# Patient Record
Sex: Female | Born: 1975 | Race: Black or African American | Hispanic: No | Marital: Single | Smoking: Never smoker
Health system: Southern US, Community
[De-identification: ages and names within clinical notes are randomized; demographics above are authoritative.]

## PROBLEM LIST (undated history)

## (undated) DIAGNOSIS — R011 Cardiac murmur, unspecified: Secondary | ICD-10-CM

## (undated) DIAGNOSIS — R51 Headache: Secondary | ICD-10-CM

## (undated) DIAGNOSIS — R3589 Other polyuria: Secondary | ICD-10-CM

## (undated) DIAGNOSIS — B001 Herpesviral vesicular dermatitis: Secondary | ICD-10-CM

## (undated) DIAGNOSIS — E785 Hyperlipidemia, unspecified: Secondary | ICD-10-CM

## (undated) DIAGNOSIS — G4730 Sleep apnea, unspecified: Secondary | ICD-10-CM

## (undated) DIAGNOSIS — Z9289 Personal history of other medical treatment: Secondary | ICD-10-CM

## (undated) DIAGNOSIS — D219 Benign neoplasm of connective and other soft tissue, unspecified: Secondary | ICD-10-CM

## (undated) DIAGNOSIS — I774 Celiac artery compression syndrome: Secondary | ICD-10-CM

## (undated) DIAGNOSIS — D649 Anemia, unspecified: Secondary | ICD-10-CM

## (undated) DIAGNOSIS — R519 Headache, unspecified: Secondary | ICD-10-CM

## (undated) HISTORY — PX: WISDOM TOOTH EXTRACTION: SHX21

## (undated) HISTORY — DX: Sleep apnea, unspecified: G47.30

## (undated) HISTORY — DX: Other polyuria: R35.89

## (undated) HISTORY — DX: Herpesviral vesicular dermatitis: B00.1

## (undated) HISTORY — PX: BREAST SURGERY: SHX581

## (undated) HISTORY — DX: Hyperlipidemia, unspecified: E78.5

## (undated) HISTORY — PX: CHOLECYSTECTOMY: SHX55

---

## 2000-05-23 ENCOUNTER — Encounter: Payer: Self-pay | Admitting: Obstetrics & Gynecology

## 2000-05-23 ENCOUNTER — Ambulatory Visit (HOSPITAL_COMMUNITY): Admission: RE | Admit: 2000-05-23 | Discharge: 2000-05-23 | Payer: Self-pay | Admitting: Obstetrics & Gynecology

## 2000-07-21 ENCOUNTER — Ambulatory Visit (HOSPITAL_COMMUNITY): Admission: RE | Admit: 2000-07-21 | Discharge: 2000-07-21 | Payer: Self-pay | Admitting: Obstetrics and Gynecology

## 2000-09-29 ENCOUNTER — Inpatient Hospital Stay (HOSPITAL_COMMUNITY): Admission: AD | Admit: 2000-09-29 | Discharge: 2000-10-01 | Payer: Self-pay | Admitting: Obstetrics & Gynecology

## 2003-06-28 ENCOUNTER — Other Ambulatory Visit: Admission: RE | Admit: 2003-06-28 | Discharge: 2003-06-28 | Payer: Self-pay | Admitting: Obstetrics & Gynecology

## 2003-11-06 ENCOUNTER — Ambulatory Visit (HOSPITAL_COMMUNITY): Admission: RE | Admit: 2003-11-06 | Discharge: 2003-11-06 | Payer: Self-pay | Admitting: Obstetrics & Gynecology

## 2003-11-06 ENCOUNTER — Other Ambulatory Visit: Admission: RE | Admit: 2003-11-06 | Discharge: 2003-11-06 | Payer: Self-pay | Admitting: Obstetrics and Gynecology

## 2004-01-20 ENCOUNTER — Inpatient Hospital Stay (HOSPITAL_COMMUNITY): Admission: AD | Admit: 2004-01-20 | Discharge: 2004-01-23 | Payer: Self-pay | Admitting: *Deleted

## 2004-02-17 ENCOUNTER — Other Ambulatory Visit: Admission: RE | Admit: 2004-02-17 | Discharge: 2004-02-17 | Payer: Self-pay | Admitting: Obstetrics and Gynecology

## 2016-02-04 ENCOUNTER — Encounter: Payer: Self-pay | Admitting: Gastroenterology

## 2016-02-04 DIAGNOSIS — K2950 Unspecified chronic gastritis without bleeding: Secondary | ICD-10-CM | POA: Diagnosis not present

## 2016-02-04 DIAGNOSIS — K317 Polyp of stomach and duodenum: Secondary | ICD-10-CM | POA: Diagnosis not present

## 2016-02-04 DIAGNOSIS — D131 Benign neoplasm of stomach: Secondary | ICD-10-CM | POA: Diagnosis not present

## 2016-02-04 DIAGNOSIS — D509 Iron deficiency anemia, unspecified: Secondary | ICD-10-CM | POA: Diagnosis not present

## 2016-02-04 DIAGNOSIS — K219 Gastro-esophageal reflux disease without esophagitis: Secondary | ICD-10-CM | POA: Diagnosis not present

## 2016-02-04 DIAGNOSIS — Z9049 Acquired absence of other specified parts of digestive tract: Secondary | ICD-10-CM | POA: Diagnosis not present

## 2016-02-04 DIAGNOSIS — F419 Anxiety disorder, unspecified: Secondary | ICD-10-CM | POA: Diagnosis not present

## 2016-02-04 DIAGNOSIS — K580 Irritable bowel syndrome with diarrhea: Secondary | ICD-10-CM | POA: Diagnosis not present

## 2016-02-04 DIAGNOSIS — R1310 Dysphagia, unspecified: Secondary | ICD-10-CM | POA: Diagnosis not present

## 2016-02-04 HISTORY — PX: ESOPHAGOGASTRODUODENOSCOPY: SHX1529

## 2016-02-06 DIAGNOSIS — Z124 Encounter for screening for malignant neoplasm of cervix: Secondary | ICD-10-CM | POA: Diagnosis not present

## 2016-02-06 DIAGNOSIS — N760 Acute vaginitis: Secondary | ICD-10-CM | POA: Diagnosis not present

## 2016-02-06 DIAGNOSIS — Z01419 Encounter for gynecological examination (general) (routine) without abnormal findings: Secondary | ICD-10-CM | POA: Diagnosis not present

## 2016-02-06 DIAGNOSIS — Z6829 Body mass index (BMI) 29.0-29.9, adult: Secondary | ICD-10-CM | POA: Diagnosis not present

## 2016-03-08 DIAGNOSIS — R197 Diarrhea, unspecified: Secondary | ICD-10-CM | POA: Diagnosis not present

## 2016-03-08 DIAGNOSIS — K2900 Acute gastritis without bleeding: Secondary | ICD-10-CM | POA: Diagnosis not present

## 2016-03-08 DIAGNOSIS — K580 Irritable bowel syndrome with diarrhea: Secondary | ICD-10-CM | POA: Diagnosis not present

## 2016-03-08 DIAGNOSIS — F419 Anxiety disorder, unspecified: Secondary | ICD-10-CM | POA: Diagnosis not present

## 2016-03-08 DIAGNOSIS — Z9049 Acquired absence of other specified parts of digestive tract: Secondary | ICD-10-CM | POA: Diagnosis not present

## 2016-03-08 DIAGNOSIS — Z79899 Other long term (current) drug therapy: Secondary | ICD-10-CM | POA: Diagnosis not present

## 2016-03-08 DIAGNOSIS — K648 Other hemorrhoids: Secondary | ICD-10-CM | POA: Diagnosis not present

## 2016-03-08 DIAGNOSIS — D131 Benign neoplasm of stomach: Secondary | ICD-10-CM | POA: Diagnosis not present

## 2016-03-08 DIAGNOSIS — D509 Iron deficiency anemia, unspecified: Secondary | ICD-10-CM | POA: Diagnosis not present

## 2016-03-08 DIAGNOSIS — K6389 Other specified diseases of intestine: Secondary | ICD-10-CM | POA: Diagnosis not present

## 2016-03-08 HISTORY — PX: COLONOSCOPY: SHX174

## 2016-05-13 DIAGNOSIS — N920 Excessive and frequent menstruation with regular cycle: Secondary | ICD-10-CM | POA: Diagnosis not present

## 2016-05-21 DIAGNOSIS — L509 Urticaria, unspecified: Secondary | ICD-10-CM | POA: Diagnosis not present

## 2016-08-02 DIAGNOSIS — N926 Irregular menstruation, unspecified: Secondary | ICD-10-CM | POA: Diagnosis not present

## 2016-08-02 DIAGNOSIS — Z113 Encounter for screening for infections with a predominantly sexual mode of transmission: Secondary | ICD-10-CM | POA: Diagnosis not present

## 2016-08-02 DIAGNOSIS — Z6829 Body mass index (BMI) 29.0-29.9, adult: Secondary | ICD-10-CM | POA: Diagnosis not present

## 2016-09-09 DIAGNOSIS — Z6829 Body mass index (BMI) 29.0-29.9, adult: Secondary | ICD-10-CM | POA: Diagnosis not present

## 2016-09-09 DIAGNOSIS — D259 Leiomyoma of uterus, unspecified: Secondary | ICD-10-CM | POA: Diagnosis not present

## 2016-09-09 DIAGNOSIS — N921 Excessive and frequent menstruation with irregular cycle: Secondary | ICD-10-CM | POA: Diagnosis not present

## 2016-09-14 DIAGNOSIS — R1031 Right lower quadrant pain: Secondary | ICD-10-CM | POA: Diagnosis not present

## 2016-09-14 DIAGNOSIS — I774 Celiac artery compression syndrome: Secondary | ICD-10-CM | POA: Diagnosis not present

## 2016-09-14 DIAGNOSIS — R079 Chest pain, unspecified: Secondary | ICD-10-CM | POA: Diagnosis not present

## 2016-09-14 DIAGNOSIS — R102 Pelvic and perineal pain: Secondary | ICD-10-CM | POA: Diagnosis not present

## 2016-09-14 DIAGNOSIS — R1032 Left lower quadrant pain: Secondary | ICD-10-CM | POA: Diagnosis not present

## 2016-09-16 ENCOUNTER — Observation Stay (HOSPITAL_COMMUNITY)
Admission: AD | Admit: 2016-09-16 | Discharge: 2016-09-17 | Disposition: A | Discharge status: Home / Self Care | Payer: BLUE CROSS/BLUE SHIELD | Source: Ambulatory Visit | Attending: Obstetrics | Admitting: Obstetrics

## 2016-09-16 ENCOUNTER — Encounter (HOSPITAL_COMMUNITY): Payer: Self-pay

## 2016-09-16 DIAGNOSIS — D62 Acute posthemorrhagic anemia: Principal | ICD-10-CM | POA: Insufficient documentation

## 2016-09-16 DIAGNOSIS — N939 Abnormal uterine and vaginal bleeding, unspecified: Secondary | ICD-10-CM | POA: Diagnosis present

## 2016-09-16 DIAGNOSIS — D500 Iron deficiency anemia secondary to blood loss (chronic): Secondary | ICD-10-CM

## 2016-09-16 DIAGNOSIS — N920 Excessive and frequent menstruation with regular cycle: Secondary | ICD-10-CM | POA: Diagnosis not present

## 2016-09-16 HISTORY — DX: Benign neoplasm of connective and other soft tissue, unspecified: D21.9

## 2016-09-16 HISTORY — DX: Celiac artery compression syndrome: I77.4

## 2016-09-16 LAB — CBC
HCT: 25 % — ABNORMAL LOW (ref 36.0–46.0)
Hemoglobin: 8.2 g/dL — ABNORMAL LOW (ref 12.0–15.0)
MCH: 27.2 pg (ref 26.0–34.0)
MCHC: 32.8 g/dL (ref 30.0–36.0)
MCV: 82.8 fL (ref 78.0–100.0)
Platelets: 258 10*3/uL (ref 150–400)
RBC: 3.02 MIL/uL — ABNORMAL LOW (ref 3.87–5.11)
RDW: 14.5 % (ref 11.5–15.5)
WBC: 9.5 10*3/uL (ref 4.0–10.5)

## 2016-09-16 LAB — URINALYSIS, ROUTINE W REFLEX MICROSCOPIC
Bilirubin Urine: NEGATIVE
Glucose, UA: NEGATIVE mg/dL
Ketones, ur: NEGATIVE mg/dL
Nitrite: NEGATIVE
Protein, ur: 100 mg/dL — AB
Specific Gravity, Urine: 1.01 (ref 1.005–1.030)
pH: 7 (ref 5.0–8.0)

## 2016-09-16 LAB — URINE MICROSCOPIC-ADD ON: Bacteria, UA: NONE SEEN

## 2016-09-16 LAB — PREPARE RBC (CROSSMATCH)

## 2016-09-16 LAB — ABO/RH: ABO/RH(D): O NEG

## 2016-09-16 MED ORDER — ESTROGENS CONJUGATED 25 MG IJ SOLR
25.0000 mg | Freq: Once | INTRAMUSCULAR | Status: DC
  Administered 2016-09-16: 25 mg via INTRAVENOUS
  Filled 2016-09-16: qty 25

## 2016-09-16 MED ORDER — FAMOTIDINE 20 MG PO TABS
20.0000 mg | ORAL_TABLET | Freq: Two times a day (BID) | ORAL | Status: DC
  Administered 2016-09-16: 20 mg via ORAL
  Filled 2016-09-16 (×2): qty 1

## 2016-09-16 MED ORDER — LACTATED RINGERS IV BOLUS (SEPSIS)
1000.0000 mL | Freq: Once | INTRAVENOUS | Status: AC
  Administered 2016-09-16: 1000 mL via INTRAVENOUS

## 2016-09-16 MED ORDER — SODIUM CHLORIDE 0.9 % IV SOLN
Freq: Once | INTRAVENOUS | Status: AC
  Administered 2016-09-16: 19:00:00 via INTRAVENOUS

## 2016-09-16 MED ORDER — ESTROGENS CONJUGATED 25 MG IJ SOLR
25.0000 mg | Freq: Four times a day (QID) | INTRAMUSCULAR | Status: DC | PRN
  Filled 2016-09-16: qty 25

## 2016-09-16 MED ORDER — IBUPROFEN 800 MG PO TABS
800.0000 mg | ORAL_TABLET | Freq: Three times a day (TID) | ORAL | Status: DC | PRN
  Administered 2016-09-16 – 2016-09-17 (×2): 800 mg via ORAL
  Filled 2016-09-16 (×2): qty 1

## 2016-09-16 MED ORDER — DIPHENHYDRAMINE HCL 25 MG PO CAPS
25.0000 mg | ORAL_CAPSULE | Freq: Once | ORAL | Status: AC
  Administered 2016-09-16: 25 mg via ORAL
  Filled 2016-09-16: qty 1

## 2016-09-16 MED ORDER — ACETAMINOPHEN 325 MG PO TABS
650.0000 mg | ORAL_TABLET | Freq: Once | ORAL | Status: AC
  Administered 2016-09-16: 650 mg via ORAL
  Filled 2016-09-16: qty 2

## 2016-09-16 NOTE — H&P (Signed)
40 y.o. [id number] presented to the office today with a five day history of acute heavy menstrual bleeding for which she was symptomatic. Has a history of heavy menstrual bleeding with typical menses lasting 5 days, managed w OCPs.  Over last 2 months, bleeding has been heavier and longer.  She had an [place] in the office on 11/10 that showed a 5 cm intramural fibroid.  Plan was for definitive therapy w TVH.   Hemoglobin at that time was 11.0  Menses started on 11/12.  Since that time, she has been changing a pad or pad + tampon q1-2 hours.  Significant clots and flooding.  An OCP taper was started on 11/13 with no improvement.  She had associated chest pain and abdominal pain on 09/14/16.  Was seen at [facility] ED. CT was negative for pulmonary embolism, aortic dissection.  Known fibroid uterus was seen.    She presented to the office today with continued heavy bleeding, passing fist sized clots and changing pad hourly.   Endorses feeling dizzy/lightheaded, seems to be worsening.  Feels like she may black out.  C/o rapid heart rate.  Hgb 11 on 11/10, 9.9 on 11/14, 8.2 today.  Had + orthostatics in MAU w pulse increase from 115 to 144 from lying to standing and a drop in diastolic BP of 0000000 points.    PMHx: Iron deficiency anemia on PO iron PSHx: cholecystectomy, c/s x 1 OB: G2P2, SVD x 1 Social: non-smoker  VS: T 99.2 P 115 BP 126/79 General: pale, tired Cardiac: tachycardic, regular rhythm Lungs: clear to auscultation Pelvic: Moderate clot cleared from vagina, active bleeding.  Cervix is dilated to 1 cm.  Uterus anteverted, mobile, 8-10 wk size, nontender.  Adnexa wnl, non-tender.    A&P:  40 yo g2p2 with acute menorrhagia to symptomatic anemia Failed outpatient management w ocps tapers Admit for IV estrogen for bleeding control q6h prn x 24h.  Pad counts Symptomatic ABLA--will transfuse 1 unit prbcs now.  Repeat orthostatic VS and 4h post transfusion cbc UPT neg in office on 11/10.  Was not  repeated in MAU--will confirm neg again today.          Kenneth

## 2016-09-16 NOTE — MAU Provider Note (Signed)
History     CSN: [id number]  Arrival date and time: 09/16/16 1248   First Provider Initiated Contact with Patient 09/16/16 1336      Chief Complaint  Patient presents with  . Vaginal Bleeding   Destiny Butler is a 40 y.o. [id number] female who presents from office for vaginal bleeding. Patient is being followed by Destiny Butler for uterine fibroids and AUB. Seen in office today. Patient reports passing multiple tennis ball sized clots. Complains of abdominal pressure and low back pain. Rates back pain 5/10. Has not treated. Changing position improves symptoms. States she feels weak and just "doesn't feel like myself".       Past Medical History:  Diagnosis Date  . Celiac artery compression syndrome (HCC)    pt told she needs to have surgery to fix   . Fibroid     Past Surgical History:  Procedure Laterality Date  . CESAREAN SECTION    . CHOLECYSTECTOMY    . WISDOM TOOTH EXTRACTION      Family History  Problem Relation Age of Onset  . Diabetes Mother   . Hypertension Mother   . Hypertension Maternal Grandmother     Social History  Substance Use Topics  . Smoking status: Never Smoker  . Smokeless tobacco: Never Used  . Alcohol use No    Allergies: No Known Allergies  Prescriptions Prior to Admission  Medication Sig Dispense Refill Last Dose  . ferrous sulfate 325 (65 FE) MG tablet Take 325 mg by mouth 3 (three) times daily with meals.   09/16/2016 at Unknown time  . MONO-LINYAH 0.25-35 MG-MCG tablet Take 3 tablets by mouth daily.  0 09/15/2016 at Unknown time    Review of Systems  Constitutional: Negative for chills and fever.  Cardiovascular: Negative for palpitations.  Gastrointestinal: Negative.   Genitourinary:       + vaginal bleeding  Musculoskeletal: Positive for back pain.  Neurological: Positive for weakness. Negative for dizziness, loss of consciousness and headaches.   Physical Exam   Blood pressure 126/79, pulse 115, temperature 99.2 F (37.3  C), temperature source Oral, resp. rate 18, height 5\' 3"  (1.6 m), weight 167 lb (75.8 kg), last menstrual period 09/12/2016, SpO2 100 %.  Orthostatic VS for the past 24 hrs:  BP- Lying Pulse- Lying BP- Sitting Pulse- Sitting BP- Standing at 0 minutes Pulse- Standing at 0 minutes  09/16/16 1327 126/79 115 129/74 123 120/73 141       Physical Exam  Nursing note and vitals reviewed. Constitutional: She is oriented to person, place, and time. She appears well-developed and well-nourished. No distress.  HENT:  Head: Normocephalic and atraumatic.  Eyes: Conjunctivae are normal. Right eye exhibits no discharge. Left eye exhibits no discharge. No scleral icterus.  Neck: Normal range of motion.  Cardiovascular: Regular rhythm and normal heart sounds.  Tachycardia present.   No murmur heard. Respiratory: Effort normal and breath sounds normal. No respiratory distress. She has no wheezes.  Neurological: She is alert and oriented to person, place, and time.  Skin: Skin is warm and dry. She is not diaphoretic.  Psychiatric: She has a normal mood and affect. Her behavior is normal. Judgment and thought content normal.    MAU Course  Procedures Results for orders placed or performed during the hospital encounter of 09/16/16 (from the past 24 hour(s))  CBC     Status: Abnormal   Collection Time: 09/16/16 12:59 PM  Result Value Ref Range   WBC 9.5 4.0 -  10.5 K/uL   RBC 3.02 (L) 3.87 - 5.11 MIL/uL   Hemoglobin 8.2 (L) 12.0 - 15.0 g/dL   HCT 25.0 (L) 36.0 - 46.0 %   MCV 82.8 78.0 - 100.0 fL   MCH 27.2 26.0 - 34.0 pg   MCHC 32.8 30.0 - 36.0 g/dL   RDW 14.5 11.5 - 15.5 %   Platelets 258 150 - 400 K/uL  Urinalysis, Routine w reflex microscopic (not at [facility])     Status: Abnormal   Collection Time: 09/16/16  1:04 PM  Result Value Ref Range   Color, Urine RED (A) YELLOW   APPearance CLOUDY (A) CLEAR   Specific Gravity, Urine 1.010 1.005 - 1.030   pH 7.0 5.0 - 8.0   Glucose, UA NEGATIVE  NEGATIVE mg/dL   Hgb urine dipstick LARGE (A) NEGATIVE   Bilirubin Urine NEGATIVE NEGATIVE   Ketones, ur NEGATIVE NEGATIVE mg/dL   Protein, ur 100 (A) NEGATIVE mg/dL   Nitrite NEGATIVE NEGATIVE   Leukocytes, UA TRACE (A) NEGATIVE  Urine microscopic-add on     Status: Abnormal   Collection Time: 09/16/16  1:04 PM  Result Value Ref Range   Squamous Epithelial / LPF 0-5 (A) NONE SEEN   WBC, UA 0-5 0 - 5 WBC/hpf   RBC / HPF TOO NUMEROUS TO COUNT 0 - 5 RBC/hpf   Bacteria, UA NONE SEEN NONE SEEN  Type and screen [place]     Status: None (Preliminary result)   Collection Time: 09/16/16  1:55 PM  Result Value Ref Range   ABO/RH(D) O NEG    Antibody Screen PENDING    Sample Expiration 09/19/2016     MDM CBC, orthostatic VS, & IV fluids per Destiny Butler Hemoglobin 8.2 -- was 9.9 on 11/14 & 11+ last week  Pt tachycardia while standing during orthostatics Destiny Butler notified of VS & hgb. IV being started. Will consent for blood. Destiny Butler working on admission orders   Assessment and Plan  A:  1. Abnormal uterine bleeding (AUB)   2. Anemia due to chronic blood loss    P: Admit to Women's unit for blood transfusion & observation  [name] 09/16/2016, 2:32 PM

## 2016-09-16 NOTE — MAU Note (Signed)
Pt sent over from MD office due to vaginal bleeding. Pt states she has a fibroid that takes up most of her uterus. Pt states she has been having progressively worse bleeding with her periods over the last year. Pt states the last two months she has been having tennis ball size clots. Pt states she been having progressively worsening pain with her periods.

## 2016-09-17 ENCOUNTER — Telehealth (HOSPITAL_COMMUNITY): Payer: Self-pay

## 2016-09-17 DIAGNOSIS — D649 Anemia, unspecified: Secondary | ICD-10-CM | POA: Diagnosis not present

## 2016-09-17 DIAGNOSIS — N939 Abnormal uterine and vaginal bleeding, unspecified: Secondary | ICD-10-CM | POA: Diagnosis not present

## 2016-09-17 LAB — TYPE AND SCREEN
ABO/RH(D): O NEG
Antibody Screen: NEGATIVE
Unit division: 0

## 2016-09-17 LAB — CBC
HCT: 21.7 % — ABNORMAL LOW (ref 36.0–46.0)
Hemoglobin: 7.4 g/dL — ABNORMAL LOW (ref 12.0–15.0)
MCH: 28.4 pg (ref 26.0–34.0)
MCHC: 34.1 g/dL (ref 30.0–36.0)
MCV: 83.1 fL (ref 78.0–100.0)
Platelets: 193 10*3/uL (ref 150–400)
RBC: 2.61 MIL/uL — ABNORMAL LOW (ref 3.87–5.11)
RDW: 14.3 % (ref 11.5–15.5)
WBC: 8.5 10*3/uL (ref 4.0–10.5)

## 2016-09-17 LAB — PREGNANCY, URINE: Preg Test, Ur: NEGATIVE

## 2016-09-17 MED ORDER — MEDROXYPROGESTERONE ACETATE 10 MG PO TABS: 10.0000 mg | ORAL_TABLET | Freq: Three times a day (TID) | ORAL | 1 refills | Status: DC

## 2016-09-17 MED ORDER — MEDROXYPROGESTERONE ACETATE 10 MG PO TABS
10.0000 mg | ORAL_TABLET | Freq: Three times a day (TID) | ORAL | Status: DC
  Administered 2016-09-17: 10 mg via ORAL
  Filled 2016-09-17: qty 1

## 2016-09-17 MED ORDER — FERROUS SULFATE 325 (65 FE) MG PO TABS
325.0000 mg | ORAL_TABLET | Freq: Three times a day (TID) | ORAL | Status: DC
  Administered 2016-09-17: 325 mg via ORAL
  Filled 2016-09-17: qty 1

## 2016-09-17 NOTE — Discharge Instructions (Signed)
Please take your iron tablets 3 times per day.   Constipation is a common side effect of iron--you can add one dose of miralax daily as needed for constipation.   Please start a provera taper:  1 tablet of provera 3 times per day x 3 days, then 2 times per day x 3 days, then 1 tablet daily until surgery.     If you experience a recurrence of heavy vaginal bleeding (soaking through > 1 pad per hour for multiple hours ina row), please call the office.    Keep your follow up appointment with Dr. Carlis Abbott on 09/30/16

## 2016-09-17 NOTE — Discharge Summary (Signed)
40 yo g2p2 admitted with symptomatic heavy menstrual bleeding and acute blood loss anemia. She was admitted for transfusion and IV estrogen.  HMB decreased significantly with 1 dose of IV premarin.  She was transitioned to a PO provera taper.   Her symptoms of dizziness, sob, palpitations resolved with transfusion of 1 U PRBCs.  Her tachycardia resolved.  As she is symptomatically improved, she was discharged home with PO iron and PO provera as above.  F/u w Dr. Carlis Abbott scheduled on 11/30

## 2016-09-17 NOTE — Progress Notes (Signed)
Pt seen and examined.  Received 1 dose of IV premarin overnight and 1 unit of PRBCs.  Tachycardia improved.  She denies further palpitations.  Reports bleeding has decreased significantly and has not passed any additional clots in the last few hours.  Pelvic/abdominal pain has improved--zantac and motrin worked well overnight.  Feels a bit better, but states that she is "still very tired and lethargic"  BP 112/69 (BP Location: Left Arm)   Pulse 90   Temp 98.6 F (37 C) (Oral)   Resp 16   Ht 5\' 3"  (1.6 m)   Wt 75.8 kg (167 lb)   LMP 09/12/2016 (Exact Date)   SpO2 100%   BMI 29.58 kg/m   NAD, pale Abd: soft, non-tender, non-distended Pelvic: pad in place for 2+ hours--light blood, no clots  Lab Results  Component Value Date   WBC 8.5 09/17/2016   HGB 7.4 (L) 09/17/2016   HCT 21.7 (L) 09/17/2016   MCV 83.1 09/17/2016   PLT 193 09/17/2016    A&P: g2p2 with ABLA secondary to HMB Bleeding has improved significantly--d/c IV estrogen and transition to PO provera ABLA--vital signs improved.  Hgb decreased post-transfusion to 7.4--I suspect that the initial hgb of 8.2 was artificially high due to dehydration. Will not transfuse second unit based on hgb alone.  Symptoms somewhat improved, VS improved.  Will have pt ambulate this AM.  If continued dizziness / SOB w ambulation, will transfuse additional unit.  If she does well w ambulation, will d/c to home PO iron TID

## 2016-09-17 NOTE — Progress Notes (Signed)
Discharge teaching complete. Pt understood all information and did not have any questions. Pt pushed via wheelchair and discharged home to family. 

## 2016-09-17 NOTE — Progress Notes (Signed)
Pt ambulated with nursing assistance this AM.  Prior to ambulation, peripad was <75% full after 4 hours improvement in bleeding. Few clots, quarter size or less w voiding.    With ambulation, pt reported fatigue, but denies dizziness, palpitations or shortness of breath.    Vital signs following ambulation: BP 129/65 (BP Location: Right Arm)   Pulse (!) 103   Temp 98.4 F (36.9 C) (Oral)   Resp 16   Ht 5\' 3"  (1.6 m)   Wt 75.8 kg (167 lb)   LMP 09/12/2016 (Exact Date)   SpO2 100%   BMI 29.58 kg/m   A&P: Doing well s/p 1 U PRBCs and 1 dose of IV premarin Will d/c home w PO provera and iron F/u scheduled.

## 2016-09-29 ENCOUNTER — Encounter: Payer: Self-pay | Admitting: Vascular Surgery

## 2016-09-30 DIAGNOSIS — Z6830 Body mass index (BMI) 30.0-30.9, adult: Secondary | ICD-10-CM | POA: Diagnosis not present

## 2016-09-30 DIAGNOSIS — Z124 Encounter for screening for malignant neoplasm of cervix: Secondary | ICD-10-CM | POA: Diagnosis not present

## 2016-09-30 DIAGNOSIS — R87619 Unspecified abnormal cytological findings in specimens from cervix uteri: Secondary | ICD-10-CM | POA: Diagnosis not present

## 2016-09-30 DIAGNOSIS — D649 Anemia, unspecified: Secondary | ICD-10-CM | POA: Diagnosis not present

## 2016-09-30 DIAGNOSIS — N72 Inflammatory disease of cervix uteri: Secondary | ICD-10-CM | POA: Diagnosis not present

## 2016-09-30 DIAGNOSIS — Z3202 Encounter for pregnancy test, result negative: Secondary | ICD-10-CM | POA: Diagnosis not present

## 2016-10-07 ENCOUNTER — Encounter: Payer: Self-pay | Admitting: Vascular Surgery

## 2016-10-08 ENCOUNTER — Encounter: Payer: Self-pay | Admitting: Vascular Surgery

## 2016-10-08 ENCOUNTER — Ambulatory Visit (INDEPENDENT_AMBULATORY_CARE_PROVIDER_SITE_OTHER): Payer: BLUE CROSS/BLUE SHIELD | Admitting: Vascular Surgery

## 2016-10-08 VITALS — BP 118/72 | HR 98 | Temp 98.4°F | Resp 16 | Ht 63.0 in | Wt 165.0 lb

## 2016-10-08 DIAGNOSIS — I774 Celiac artery compression syndrome: Secondary | ICD-10-CM

## 2016-10-08 DIAGNOSIS — I771 Stricture of artery: Secondary | ICD-10-CM | POA: Insufficient documentation

## 2016-10-08 NOTE — Progress Notes (Signed)
Referred by:  [facility]  Reason for referral: celiac artery compression    History of Present Illness  Destiny Butler is a 40 y.o. ([birth year]) female who presents with cc: lower abd/pelvic pain.  This patient has known history of heavy menses and cramping she associates with uterine fibroids.  She denies any weight loss.  She denies any post-prandial pain or food fear.  She recently was transfused for presumed menometrorrhagia.    Past Medical History:  Diagnosis Date  . Celiac artery compression syndrome (HCC)    pt told she needs to have surgery to fix   . Fibroid     Past Surgical History:  Procedure Laterality Date  . CESAREAN SECTION    . CHOLECYSTECTOMY    . WISDOM TOOTH EXTRACTION      Social History   Social History  . Marital status: Single    Spouse name: N/A  . Number of children: N/A  . Years of education: N/A   Occupational History  . Not on file.   Social History Main Topics  . Smoking status: Never Smoker  . Smokeless tobacco: Never Used  . Alcohol use No  . Drug use: No  . Sexual activity: Not on file   Other Topics Concern  . Not on file   Social History Narrative  . No narrative on file    Family History  Problem Relation Age of Onset  . Diabetes Mother   . Hypertension Mother   . Hypertension Maternal Grandmother     Current Outpatient Prescriptions  Medication Sig Dispense Refill  . ferrous sulfate 325 (65 FE) MG tablet Take 325 mg by mouth 3 (three) times daily with meals.    . medroxyPROGESTERone (PROVERA) 10 MG tablet Take 1 tablet (10 mg total) by mouth 3 (three) times daily. 90 tablet 1   No current facility-administered medications for this visit.     No Known Allergies   REVIEW OF SYSTEMS:   Cardiac:  positive for: no symptoms, negative for: Chest pain or chest pressure, Shortness of breath upon exertion and Shortness of breath when lying flat,   Vascular:  positive for: no symptoms,  negative for: Pain  in calf, thigh, or hip brought on by ambulation, Pain in feet at night that wakes you up from your sleep, Blood clot in your veins and Leg swelling  Pulmonary:  positive for: no symptoms,  negative for: Oxygen at home, Productive cough and Wheezing  Neurologic:  positive for: No symptoms, negative for: Sudden weakness in arms or legs, Sudden numbness in arms or legs, Sudden onset of difficulty speaking or slurred speech, Temporary loss of vision in one eye and Problems with dizziness  Gastrointestinal:  positive for: no symptoms, negative for: Blood in stool and Vomited blood  Genitourinary:  positive for: no symptoms, negative for: Burning when urinating and Blood in urine  Psychiatric:  positive for: no symptoms,  negative for: Major depression  Hematologic:  positive for: no symptoms,  negative for: negative for: Bleeding problems and Problems with blood clotting too easily  Dermatologic:  positive for: no symptoms, negative for: Rashes or ulcers  Constitutional:  positive for: no symptoms, negative for: Fever or chills  Ear/Nose/Throat:  positive for: no symptoms, negative for: Change in hearing, Nose bleeds and Sore throat  Musculoskeletal:  positive for: no symptoms, negative for: Back pain, Joint pain and Muscle pain   Physical Examination  Vitals:   10/08/16 1022  BP: 118/72  Pulse:  98  Resp: 16  Temp: 98.4 F (36.9 C)  TempSrc: Oral  SpO2: 100%  Weight: 165 lb (74.8 kg)  Height: 5\' 3"  (1.6 m)    Body mass index is 29.23 kg/m.  General: Alert, O x 3, WD,NAD  Head: [place]/AT,   Ear/Nose/Throat: Hearing grossly intact, nares without erythema or drainage, oropharynx without Erythema or Exudate , Mallampati score: 3, Dentition intact  Eyes: PERRLA, EOMI,   Neck: Supple, mid-line trachea,    Pulmonary: Sym exp, good B air movt,CTA B  Cardiac: RRR, Nl S1, S2, no Murmurs, No rubs, No S3,S4  Vascular: Vessel Right Left  Radial Palpable Palpable   Brachial Palpable Palpable  Carotid Palpable, No Bruit Palpable, No Bruit  Aorta Not palpable N/A  Femoral Palpable Palpable  Popliteal Not palpable Not palpable  PT Palpable Palpable  DP Palpable Palpable   Gastrointestinal: soft, non-distended, non-tender to palpation, No guarding or rebound, no HSM, no masses, no CVAT B, No palpable prominent aortic pulse,  no epigastric bruit  Musculoskeletal: M/S 5/5 throughout  , Extremities without ischemic changes  , No edema present,  no significant varicosities, No LDS present  Neurologic: CN 2-12 intact , Pain and light touch intact in extremities , Motor exam as listed above  Psychiatric: Judgement intact, Mood & affect appropriate for pt's clinical situation  Dermatologic: See M/S exam for extremity exam, No rashes otherwise noted  Lymph : Palpable lymph nodes: None  CTA chest/abd/pelvis (09/14/16) 1. No CT evidence for acute aortic dissection or other acute aortic pathology. 2. Status post cholecystectomy with associated intra and extrahepatic biliary dilatation, with mild prominence of the pancreatic duct. While this may be related to post cholecystectomy changes, finding raises the possibility for possible stricture at the level of the sphincter of Oddi. Correlation with LFTs suggested. 3. Focal severe stenosis at the origin of the celiac axis without associated atheromatous disease. 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 4. Fibroid uterus.  I reviewed this patient's CTA, there is a suggestion of compression of the proximal celiac artery with some post stenotic dilation.  Given the dynamic nature of celiac artery compression, however, this is not definitive.  Outside Studies/Documentation 3 pages of outside documents were reviewed including: ED report.   Medical Decision Making  NOTASHA ALERS is a 40 y.o. female who presents with: uterine fibroids with bleeding complication, possible asx celiac artery  compression   This patient does NOT have celiac artery compression sx as she does NOT have a pain pattern consistent with such.  The character of her pain appears to be more consistent with her uterus.  Celiac artery compression needs to be evaluated with dynamic studies such as post-inspiration and post-expiration mesenteric angiogram to evaluate the degree of compression.  A one-shot such as a CTA is inadequate study.  Given the anatomic involvement of the celiac artery in the diaphragm, normal breathing will result in compression of the celiac artery naturally.  Essentially, I wouldn't diagnose celiac artery compression in a young patient without angiographic studies.    Additionally, the benefit of a celiac artery decompression is due to stripping the splanchnic nerves off the celiac artery.  As this pt doesn't have pain that would benefit from such, I doubt the risk can be justified at this point.  At this point, I would focus on her gynecologic issues and return to the celiac artery evaluation if she become symptomatic.  Thank you for allowing [place] to participate in this patient's care.  Adele Barthel, MD, FACS [facility] Office: [phone] Pager: [phone]  10/08/2016, 10:49 AM

## 2016-10-14 ENCOUNTER — Encounter: Payer: BLUE CROSS/BLUE SHIELD | Admitting: Vascular Surgery

## 2016-12-01 ENCOUNTER — Encounter: Payer: Self-pay | Admitting: *Deleted

## 2016-12-06 DIAGNOSIS — D649 Anemia, unspecified: Secondary | ICD-10-CM | POA: Diagnosis not present

## 2017-01-04 NOTE — Telephone Encounter (Signed)
See "Contacts" 

## 2017-01-15 DIAGNOSIS — B001 Herpesviral vesicular dermatitis: Secondary | ICD-10-CM | POA: Diagnosis not present

## 2017-02-14 DIAGNOSIS — Z01419 Encounter for gynecological examination (general) (routine) without abnormal findings: Secondary | ICD-10-CM | POA: Diagnosis not present

## 2017-02-14 DIAGNOSIS — Z6830 Body mass index (BMI) 30.0-30.9, adult: Secondary | ICD-10-CM | POA: Diagnosis not present

## 2017-03-07 DIAGNOSIS — Z79899 Other long term (current) drug therapy: Secondary | ICD-10-CM | POA: Diagnosis not present

## 2017-03-07 DIAGNOSIS — Z131 Encounter for screening for diabetes mellitus: Secondary | ICD-10-CM | POA: Diagnosis not present

## 2017-03-07 DIAGNOSIS — Z13220 Encounter for screening for lipoid disorders: Secondary | ICD-10-CM | POA: Diagnosis not present

## 2017-03-07 DIAGNOSIS — Z0000 Encounter for general adult medical examination without abnormal findings: Secondary | ICD-10-CM | POA: Diagnosis not present

## 2017-04-25 DIAGNOSIS — N898 Other specified noninflammatory disorders of vagina: Secondary | ICD-10-CM | POA: Diagnosis not present

## 2017-04-27 NOTE — Patient Instructions (Signed)
Your procedure is scheduled on:  Wednesday, May 11, 2017  Enter through the [employer] [facility] at:  7:00 AM  Pick up the phone at the desk and dial [phone].  Call this number if you have problems the morning of surgery: [phone].  Remember: Do NOT eat food or drink after:  Midnight Tuesday  Take these medicines the morning of surgery with a SIP OF WATER:  None  Stop ALL herbal medications at this time  Do NOT smoke the day of surgery.  Do NOT wear jewelry (body piercing), metal hair clips/bobby pins, make-up, artifical eyelashes or nail polish. Do NOT wear lotions, powders, or perfumes.  You may wear deodorant. Do NOT shave for 48 hours prior to surgery. Do NOT bring valuables to the hospital. Contacts, dentures, or bridgework may not be worn into surgery.  Leave suitcase in car.  After surgery it may be brought to your room.  For patients admitted to the hospital, checkout time is 11:00 AM the day of discharge.  Bring a copy of your healthcare power of attorney and living will documents.

## 2017-04-29 ENCOUNTER — Encounter (HOSPITAL_COMMUNITY): Payer: Self-pay

## 2017-04-29 ENCOUNTER — Encounter (HOSPITAL_COMMUNITY)
Admission: RE | Admit: 2017-04-29 | Discharge: 2017-04-29 | Disposition: A | Discharge status: Home / Self Care | Payer: BLUE CROSS/BLUE SHIELD | Source: Ambulatory Visit | Attending: Obstetrics | Admitting: Obstetrics

## 2017-04-29 DIAGNOSIS — Z01812 Encounter for preprocedural laboratory examination: Secondary | ICD-10-CM | POA: Diagnosis not present

## 2017-04-29 DIAGNOSIS — D259 Leiomyoma of uterus, unspecified: Secondary | ICD-10-CM | POA: Diagnosis not present

## 2017-04-29 HISTORY — DX: Headache: R51

## 2017-04-29 HISTORY — DX: Cardiac murmur, unspecified: R01.1

## 2017-04-29 HISTORY — DX: Personal history of other medical treatment: Z92.89

## 2017-04-29 HISTORY — DX: Headache, unspecified: R51.9

## 2017-04-29 HISTORY — DX: Anemia, unspecified: D64.9

## 2017-04-29 LAB — TYPE AND SCREEN
ABO/RH(D): O NEG
Antibody Screen: NEGATIVE

## 2017-04-29 LAB — CBC
HCT: 44.3 % (ref 36.0–46.0)
Hemoglobin: 15.4 g/dL — ABNORMAL HIGH (ref 12.0–15.0)
MCH: 31.3 pg (ref 26.0–34.0)
MCHC: 34.8 g/dL (ref 30.0–36.0)
MCV: 90 fL (ref 78.0–100.0)
Platelets: 219 10*3/uL (ref 150–400)
RBC: 4.92 MIL/uL (ref 3.87–5.11)
RDW: 12.8 % (ref 11.5–15.5)
WBC: 5.8 10*3/uL (ref 4.0–10.5)

## 2017-04-29 LAB — BASIC METABOLIC PANEL
Anion gap: 6 (ref 5–15)
BUN: 7 mg/dL (ref 6–20)
CO2: 26 mmol/L (ref 22–32)
Calcium: 9.3 mg/dL (ref 8.9–10.3)
Chloride: 105 mmol/L (ref 101–111)
Creatinine, Ser: 0.68 mg/dL (ref 0.44–1.00)
GFR calc Af Amer: >60 mL/min (ref >60)
GFR calc non Af Amer: >60 mL/min (ref >60)
Glucose, Bld: 94 mg/dL (ref 65–99)
Potassium: 3.7 mmol/L (ref 3.5–5.1)
Sodium: 137 mmol/L (ref 135–145)

## 2017-05-09 NOTE — Anesthesia Preprocedure Evaluation (Signed)
Anesthesia Evaluation  Patient identified by MRN, date of birth, ID band Patient awake    Reviewed: Allergy & Precautions, H&P , Patient's Chart, lab work & pertinent test results, reviewed documented beta blocker date and time   Airway Mallampati: II  TM Distance: >3 FB Neck ROM: full    Dental no notable dental hx.    Pulmonary    Pulmonary exam normal breath sounds clear to auscultation       Cardiovascular  Rhythm:regular Rate:Normal     Neuro/Psych    GI/Hepatic   Endo/Other    Renal/GU      Musculoskeletal   Abdominal   Peds  Hematology   Anesthesia Other Findings   Reproductive/Obstetrics                             Anesthesia Physical Anesthesia Plan  ASA: II  Anesthesia Plan: General   Post-op Pain Management:    Induction: Intravenous  PONV Risk Score and Plan: 2 and Ondansetron, Dexamethasone, Propofol and Scopolamine patch - Pre-op  Airway Management Planned: Oral ETT  Additional Equipment:   Intra-op Plan:   Post-operative Plan: Extubation in OR  Informed Consent: I have reviewed the patients History and Physical, chart, labs and discussed the procedure including the risks, benefits and alternatives for the proposed anesthesia with the patient or authorized representative who has indicated his/her understanding and acceptance.   Dental Advisory Given  Plan Discussed with: CRNA and Surgeon  Anesthesia Plan Comments: (  )        Anesthesia Quick Evaluation

## 2017-05-10 NOTE — H&P (Signed)
41 y.o. Destiny Butler presents LAVH and bilateral salpingectomy for menorrhagia and uterine fibroids.    She has a two year history of heavy menstrual bleeding.  It was controlled initially by OCPs, but in Nov 2017 she required admission to [facility] for symptomatic ABLA and blood transfusion despite OCP use.  Largest fibroid is an anterior 5 cm intramural fibroid and desires definitive surgical management.  Bleeding preop has been controlled w PO provera  Past Medical History:  Diagnosis Date  . Anemia   . Celiac artery compression syndrome (HCC)    pt told she needs to have surgery to fix   . Fibroid   . Headache    Migraines  . Heart murmur    no problems  . History of blood transfusion     Past Surgical History:  Procedure Laterality Date  . BREAST SURGERY     tissue removed under left arm  . CESAREAN SECTION    . CHOLECYSTECTOMY    . WISDOM TOOTH EXTRACTION      OB History  Gravida Para Term Preterm AB Living  2 2 2     2   SAB TAB Ectopic Multiple Live Births          2    # Outcome Date GA Lbr Len/2nd Weight Sex Delivery Anes PTL Lv  2 Term           1 Term               Social History   Social History  . Marital status: Single    Spouse name: N/A  . Number of children: N/A  . Years of education: N/A   Occupational History  . Not on file.   Social History Main Topics  . Smoking status: Never Smoker  . Smokeless tobacco: Never Used  . Alcohol use No  . Drug use: No  . Sexual activity: Not on file   Other Topics Concern  . Not on file   Social History Narrative  . No narrative on file   Patient has no known allergies.    There were no vitals filed for this visit.   General:  NAD Lungs: CTAB Cardiac: RRR Abdomen:  Soft Ex:  no edema   UPT neg in holding.     A/P   41 y.o. presents for LAVH and bilateral salpingectomy, possible cystoscopy for HMB and uterine fibroids  Discussed in detail risk, benefits, alternatives to procedures.  She understands the  risks to include infection, bleeding, damage to surrounding structures (including but not limited to bowel, bladder, ovaries, nerves, vessels), conversion to laparotomy, need for additional procedures, risk of vte, fistula, need for blood transfusion.  She understands and elects to proceed.  Consent signed.  Ancef 2gm on call to [facility]

## 2017-05-11 ENCOUNTER — Ambulatory Visit (HOSPITAL_COMMUNITY): Payer: BLUE CROSS/BLUE SHIELD | Admitting: Anesthesiology

## 2017-05-11 ENCOUNTER — Observation Stay (HOSPITAL_COMMUNITY)
Admission: RE | Admit: 2017-05-11 | Discharge: 2017-05-12 | Disposition: A | Discharge status: Home / Self Care | Payer: BLUE CROSS/BLUE SHIELD | Source: Ambulatory Visit | Attending: Obstetrics | Admitting: Obstetrics

## 2017-05-11 ENCOUNTER — Encounter (HOSPITAL_COMMUNITY)
Admission: RE | Disposition: A | Discharge status: Home / Self Care | Payer: Self-pay | Source: Ambulatory Visit | Attending: Obstetrics

## 2017-05-11 ENCOUNTER — Encounter (HOSPITAL_COMMUNITY): Payer: Self-pay | Admitting: *Deleted

## 2017-05-11 DIAGNOSIS — D252 Subserosal leiomyoma of uterus: Principal | ICD-10-CM | POA: Insufficient documentation

## 2017-05-11 DIAGNOSIS — D251 Intramural leiomyoma of uterus: Secondary | ICD-10-CM | POA: Diagnosis not present

## 2017-05-11 DIAGNOSIS — N920 Excessive and frequent menstruation with regular cycle: Secondary | ICD-10-CM | POA: Diagnosis not present

## 2017-05-11 DIAGNOSIS — I774 Celiac artery compression syndrome: Secondary | ICD-10-CM | POA: Diagnosis not present

## 2017-05-11 DIAGNOSIS — Z79899 Other long term (current) drug therapy: Secondary | ICD-10-CM | POA: Diagnosis not present

## 2017-05-11 DIAGNOSIS — N926 Irregular menstruation, unspecified: Secondary | ICD-10-CM | POA: Diagnosis not present

## 2017-05-11 DIAGNOSIS — D259 Leiomyoma of uterus, unspecified: Secondary | ICD-10-CM | POA: Diagnosis not present

## 2017-05-11 DIAGNOSIS — D219 Benign neoplasm of connective and other soft tissue, unspecified: Secondary | ICD-10-CM | POA: Diagnosis present

## 2017-05-11 DIAGNOSIS — D62 Acute posthemorrhagic anemia: Secondary | ICD-10-CM | POA: Diagnosis not present

## 2017-05-11 HISTORY — PX: LAPAROSCOPIC VAGINAL HYSTERECTOMY WITH SALPINGECTOMY: SHX6680

## 2017-05-11 LAB — PREGNANCY, URINE: Preg Test, Ur: NEGATIVE

## 2017-05-11 SURGERY — HYSTERECTOMY, VAGINAL, LAPAROSCOPY-ASSISTED, WITH SALPINGECTOMY
Anesthesia: General

## 2017-05-11 MED ORDER — ROCURONIUM BROMIDE 100 MG/10ML IV SOLN
INTRAVENOUS | Status: DC | PRN
  Administered 2017-05-11 (×2): 10 mg via INTRAVENOUS
  Administered 2017-05-11: 40 mg via INTRAVENOUS

## 2017-05-11 MED ORDER — LIDOCAINE HCL (CARDIAC) 20 MG/ML IV SOLN
INTRAVENOUS | Status: DC | PRN
  Administered 2017-05-11: 50 mg via INTRAVENOUS

## 2017-05-11 MED ORDER — ONDANSETRON HCL 4 MG/2ML IJ SOLN
INTRAMUSCULAR | Status: DC | PRN
  Administered 2017-05-11: 4 mg via INTRAVENOUS

## 2017-05-11 MED ORDER — SIMETHICONE 80 MG PO CHEW: 80.0000 mg | CHEWABLE_TABLET | Freq: Four times a day (QID) | ORAL | Status: DC | PRN

## 2017-05-11 MED ORDER — ACETAMINOPHEN 160 MG/5ML PO SOLN
ORAL | Status: AC
  Administered 2017-05-11: 975 mg via ORAL
  Filled 2017-05-11: qty 40.6

## 2017-05-11 MED ORDER — HYDROMORPHONE HCL 1 MG/ML IJ SOLN
INTRAMUSCULAR | Status: AC
  Filled 2017-05-11: qty 1

## 2017-05-11 MED ORDER — KETOROLAC TROMETHAMINE 30 MG/ML IJ SOLN
INTRAMUSCULAR | Status: AC
  Filled 2017-05-11: qty 1

## 2017-05-11 MED ORDER — FENTANYL CITRATE (PF) 250 MCG/5ML IJ SOLN
INTRAMUSCULAR | Status: AC
  Filled 2017-05-11: qty 5

## 2017-05-11 MED ORDER — KETOROLAC TROMETHAMINE 30 MG/ML IJ SOLN
30.0000 mg | Freq: Once | INTRAMUSCULAR | Status: AC
  Administered 2017-05-11: 30 mg via INTRAVENOUS

## 2017-05-11 MED ORDER — DEXAMETHASONE SODIUM PHOSPHATE 10 MG/ML IJ SOLN
INTRAMUSCULAR | Status: AC
  Filled 2017-05-11: qty 1

## 2017-05-11 MED ORDER — HYDROMORPHONE HCL 1 MG/ML IJ SOLN
0.2500 mg | INTRAMUSCULAR | Status: DC | PRN
  Administered 2017-05-11 (×2): 0.25 mg via INTRAVENOUS

## 2017-05-11 MED ORDER — ACETAMINOPHEN 160 MG/5ML PO SOLN
975.0000 mg | Freq: Once | ORAL | Status: AC
  Administered 2017-05-11: 975 mg via ORAL

## 2017-05-11 MED ORDER — CEFAZOLIN SODIUM-DEXTROSE 2-4 GM/100ML-% IV SOLN
2.0000 g | INTRAVENOUS | Status: AC
  Administered 2017-05-11: 2 g via INTRAVENOUS

## 2017-05-11 MED ORDER — MENTHOL 3 MG MT LOZG
1.0000 | LOZENGE | OROMUCOSAL | Status: DC | PRN
  Administered 2017-05-11: 3 mg via ORAL
  Filled 2017-05-11: qty 9

## 2017-05-11 MED ORDER — MIDAZOLAM HCL 2 MG/2ML IJ SOLN
INTRAMUSCULAR | Status: DC | PRN
  Administered 2017-05-11: 2 mg via INTRAVENOUS

## 2017-05-11 MED ORDER — LORATADINE 10 MG PO TABS
10.0000 mg | ORAL_TABLET | Freq: Every day | ORAL | Status: DC
  Administered 2017-05-11 – 2017-05-12 (×2): 10 mg via ORAL
  Filled 2017-05-11 (×2): qty 1

## 2017-05-11 MED ORDER — BUPIVACAINE HCL (PF) 0.25 % IJ SOLN
INTRAMUSCULAR | Status: DC | PRN
  Administered 2017-05-11: 7 mL

## 2017-05-11 MED ORDER — ROCURONIUM BROMIDE 100 MG/10ML IV SOLN
INTRAVENOUS | Status: AC
  Filled 2017-05-11: qty 1

## 2017-05-11 MED ORDER — OXYCODONE HCL 5 MG PO TABS
5.0000 mg | ORAL_TABLET | ORAL | Status: DC | PRN
  Administered 2017-05-11: 5 mg via ORAL
  Filled 2017-05-11: qty 1

## 2017-05-11 MED ORDER — BUPIVACAINE HCL (PF) 0.25 % IJ SOLN
INTRAMUSCULAR | Status: AC
  Filled 2017-05-11: qty 30

## 2017-05-11 MED ORDER — ONDANSETRON HCL 4 MG/2ML IJ SOLN
INTRAMUSCULAR | Status: AC
  Filled 2017-05-11: qty 2

## 2017-05-11 MED ORDER — FENTANYL CITRATE (PF) 100 MCG/2ML IJ SOLN
INTRAMUSCULAR | Status: DC | PRN
  Administered 2017-05-11: 50 ug via INTRAVENOUS
  Administered 2017-05-11 (×2): 100 ug via INTRAVENOUS
  Administered 2017-05-11: 50 ug via INTRAVENOUS

## 2017-05-11 MED ORDER — DEXAMETHASONE SODIUM PHOSPHATE 4 MG/ML IJ SOLN
INTRAMUSCULAR | Status: DC | PRN
  Administered 2017-05-11 (×2): 5 mg via INTRAVENOUS

## 2017-05-11 MED ORDER — ONDANSETRON HCL 4 MG PO TABS: 4.0000 mg | ORAL_TABLET | Freq: Four times a day (QID) | ORAL | Status: DC | PRN

## 2017-05-11 MED ORDER — HYDROMORPHONE HCL 1 MG/ML IJ SOLN
INTRAMUSCULAR | Status: DC | PRN
  Administered 2017-05-11: 1 mg via INTRAVENOUS

## 2017-05-11 MED ORDER — SUGAMMADEX SODIUM 200 MG/2ML IV SOLN
INTRAVENOUS | Status: AC
  Filled 2017-05-11: qty 2

## 2017-05-11 MED ORDER — LIDOCAINE HCL (CARDIAC) 20 MG/ML IV SOLN
INTRAVENOUS | Status: AC
  Filled 2017-05-11: qty 5

## 2017-05-11 MED ORDER — FENTANYL CITRATE (PF) 100 MCG/2ML IJ SOLN
INTRAMUSCULAR | Status: AC
  Filled 2017-05-11: qty 2

## 2017-05-11 MED ORDER — SCOPOLAMINE 1 MG/3DAYS TD PT72
MEDICATED_PATCH | TRANSDERMAL | Status: AC
  Administered 2017-05-11: 1.5 mg via TRANSDERMAL
  Filled 2017-05-11: qty 1

## 2017-05-11 MED ORDER — LACTATED RINGERS IV SOLN
INTRAVENOUS | Status: DC
Start: 2017-05-11 — End: 2017-05-11

## 2017-05-11 MED ORDER — PROPOFOL 10 MG/ML IV BOLUS
INTRAVENOUS | Status: AC
  Filled 2017-05-11: qty 20

## 2017-05-11 MED ORDER — ACETAMINOPHEN 325 MG PO TABS: 650.0000 mg | ORAL_TABLET | Freq: Four times a day (QID) | ORAL | Status: DC | PRN

## 2017-05-11 MED ORDER — SENNOSIDES-DOCUSATE SODIUM 8.6-50 MG PO TABS: 1.0000 | ORAL_TABLET | Freq: Every evening | ORAL | Status: DC | PRN

## 2017-05-11 MED ORDER — ONDANSETRON HCL 4 MG/2ML IJ SOLN: 4.0000 mg | Freq: Four times a day (QID) | INTRAMUSCULAR | Status: DC | PRN

## 2017-05-11 MED ORDER — SUGAMMADEX SODIUM 200 MG/2ML IV SOLN
INTRAVENOUS | Status: DC | PRN
  Administered 2017-05-11: 160 mg via INTRAVENOUS

## 2017-05-11 MED ORDER — SCOPOLAMINE 1 MG/3DAYS TD PT72
1.0000 | MEDICATED_PATCH | Freq: Once | TRANSDERMAL | Status: DC
  Administered 2017-05-11: 1.5 mg via TRANSDERMAL

## 2017-05-11 MED ORDER — SODIUM CHLORIDE 0.9 % IJ SOLN
INTRAMUSCULAR | Status: AC
  Filled 2017-05-11: qty 100

## 2017-05-11 MED ORDER — VASOPRESSIN 20 UNIT/ML IV SOLN
INTRAVENOUS | Status: AC
  Filled 2017-05-11: qty 1

## 2017-05-11 MED ORDER — HYDROMORPHONE HCL 1 MG/ML IJ SOLN: 0.2000 mg | INTRAMUSCULAR | Status: DC | PRN

## 2017-05-11 MED ORDER — LACTATED RINGERS IV SOLN
INTRAVENOUS | Status: DC
  Administered 2017-05-11 (×4): via INTRAVENOUS

## 2017-05-11 MED ORDER — HYDROMORPHONE HCL 1 MG/ML IJ SOLN
INTRAMUSCULAR | Status: AC
  Filled 2017-05-11: qty 0.5

## 2017-05-11 MED ORDER — MIDAZOLAM HCL 2 MG/2ML IJ SOLN
INTRAMUSCULAR | Status: AC
  Filled 2017-05-11: qty 2

## 2017-05-11 MED ORDER — PROPOFOL 10 MG/ML IV BOLUS
INTRAVENOUS | Status: DC | PRN
  Administered 2017-05-11: 200 mg via INTRAVENOUS

## 2017-05-11 MED ORDER — VASOPRESSIN 20 UNIT/ML IJ SOLN
INTRAMUSCULAR | Status: DC | PRN
  Administered 2017-05-11: 20 [IU]

## 2017-05-11 MED ORDER — IBUPROFEN 600 MG PO TABS
600.0000 mg | ORAL_TABLET | Freq: Four times a day (QID) | ORAL | Status: DC
  Administered 2017-05-11 – 2017-05-12 (×3): 600 mg via ORAL
  Filled 2017-05-11 (×3): qty 1

## 2017-05-11 MED ORDER — GLYCOPYRROLATE 0.2 MG/ML IJ SOLN
INTRAMUSCULAR | Status: DC | PRN
  Administered 2017-05-11: 0.1 mg via INTRAVENOUS

## 2017-05-11 SURGICAL SUPPLY — 47 items
CLOTH BEACON ORANGE TIMEOUT ST (SAFETY) ×3 IMPLANT
COVER BACK TABLE 60X90IN (DRAPES) ×3 IMPLANT
DECANTER SPIKE VIAL GLASS SM (MISCELLANEOUS) ×3 IMPLANT
DERMABOND ADVANCED (GAUZE/BANDAGES/DRESSINGS) ×1
DERMABOND ADVANCED .7 DNX12 (GAUZE/BANDAGES/DRESSINGS) ×2 IMPLANT
DRSG OPSITE POSTOP 3X4 (GAUZE/BANDAGES/DRESSINGS) ×3 IMPLANT
DURAPREP 26ML APPLICATOR (WOUND CARE) ×3 IMPLANT
ELECT REM PT RETURN 9FT ADLT (ELECTROSURGICAL) ×3
ELECTRODE REM PT RTRN 9FT ADLT (ELECTROSURGICAL) ×2 IMPLANT
GLOVE BIOGEL PI IND STRL 6.5 (GLOVE) ×6 IMPLANT
GLOVE BIOGEL PI IND STRL 7.0 (GLOVE) ×4 IMPLANT
GLOVE BIOGEL PI INDICATOR 6.5 (GLOVE) ×3
GLOVE BIOGEL PI INDICATOR 7.0 (GLOVE) ×2
GLOVE ECLIPSE 6.0 STRL STRAW (GLOVE) ×6 IMPLANT
IV LACTATED RINGER IRRG 3000ML (IV SOLUTION) ×1
IV LR IRRIG 3000ML ARTHROMATIC (IV SOLUTION) ×2 IMPLANT
LEGGING LITHOTOMY PAIR STRL (DRAPES) ×3 IMPLANT
LIGASURE VESSEL 5MM BLUNT TIP (ELECTROSURGICAL) ×3 IMPLANT
NS IRRIG 1000ML POUR BTL (IV SOLUTION) ×3 IMPLANT
PACK LAVH (CUSTOM PROCEDURE TRAY) ×3 IMPLANT
PACK ROBOTIC GOWN (GOWN DISPOSABLE) ×3 IMPLANT
PACK TRENDGUARD 450 HYBRID PRO (MISCELLANEOUS) ×2 IMPLANT
PACK TRENDGUARD 600 HYBRD PROC (MISCELLANEOUS) IMPLANT
POUCH LAPAROSCOPIC INSTRUMENT (MISCELLANEOUS) ×3 IMPLANT
PROTECTOR NERVE ULNAR (MISCELLANEOUS) ×6 IMPLANT
SET CYSTO W/LG BORE CLAMP LF (SET/KITS/TRAYS/PACK) ×3 IMPLANT
SET IRRIG TUBING LAPAROSCOPIC (IRRIGATION / IRRIGATOR) ×3 IMPLANT
SLEEVE XCEL OPT CAN 5 100 (ENDOMECHANICALS) ×3 IMPLANT
SUT MON AB 4-0 PS1 27 (SUTURE) ×3 IMPLANT
SUT VIC AB 0 CT1 18XCR BRD8 (SUTURE) ×4 IMPLANT
SUT VIC AB 0 CT1 27 (SUTURE) ×2
SUT VIC AB 0 CT1 27XBRD ANBCTR (SUTURE) ×4 IMPLANT
SUT VIC AB 0 CT1 8-18 (SUTURE) ×2
SUT VIC AB 2-0 CT1 27 (SUTURE) ×1
SUT VIC AB 2-0 CT1 TAPERPNT 27 (SUTURE) ×2 IMPLANT
SUT VIC AB 3-0 SH 27 (SUTURE) ×2
SUT VIC AB 3-0 SH 27XBRD (SUTURE) ×4 IMPLANT
SUT VICRYL 0 TIES 12 18 (SUTURE) ×3 IMPLANT
SUT VICRYL 0 UR6 27IN ABS (SUTURE) ×3 IMPLANT
SYR BULB IRRIGATION 50ML (SYRINGE) ×3 IMPLANT
TOWEL OR 17X24 6PK STRL BLUE (TOWEL DISPOSABLE) ×6 IMPLANT
TRAY FOLEY CATH SILVER 14FR (SET/KITS/TRAYS/PACK) ×3 IMPLANT
TRENDGUARD 450 HYBRID PRO PACK (MISCELLANEOUS) ×3
TRENDGUARD 600 HYBRID PROC PK (MISCELLANEOUS)
TROCAR XCEL NON-BLD 11X100MML (ENDOMECHANICALS) ×3 IMPLANT
TROCAR XCEL NON-BLD 5MMX100MML (ENDOMECHANICALS) ×3 IMPLANT
WARMER LAPAROSCOPE (MISCELLANEOUS) ×3 IMPLANT

## 2017-05-11 NOTE — Progress Notes (Signed)
Pt doing well.  Tolerating clear liquids.  Mild lower abdominal pain and throat pain.  Has not yet ambulated  BP (!) 143/79 (BP Location: Right Arm)   Pulse 80   Temp 98.3 F (36.8 C) (Oral)   Resp 16   Ht 5\' 3"  (1.6 m)   Wt 79.8 kg (176 lb)   LMP 04/15/2017 (Approximate)   SpO2 100%   BMI 31.18 kg/m   Uop 300cc/hr  NAD Abd soft, app TTP.  Incisions c/d/i Ext: SCDs  A&P: POD#0 s/p lavh/ b/l salpingectomy for HMB/fibroids D/c foley and IVF Advance diet OOB to ambulate SCDs

## 2017-05-11 NOTE — Op Note (Signed)
PATIENT:  Destiny Butler  41 y.o. female  PRE-OPERATIVE DIAGNOSIS:  FIBROIDS, HEAVY MENSTRUAL BLEEDING  POST-OPERATIVE DIAGNOSIS:  FIBROIDS, HEAVY MENSTRUAL BLEEDING  PROCEDURE:  Procedure(s): LAPAROSCOPIC ASSISTED VAGINAL HYSTERECTOMY WITH SALPINGECTOMY (Bilateral)  SURGEON:  Surgeon(s) and Role:    Jerelyn Charles, MD - Primary    * Bobbye Charleston, MD - Assisting  ANESTHESIA:   general  EBL:  Total I/O In: 2000 [I.V.:2000] Out: 425 [Urine:125; Blood:300]  Operative Findings: Enlarged 8-10 week size anteverted uterus with large dominant fibroid occupying the fundus and small subserosal 1.5 cm fibroid at the fundus.    Specimen: uterus, cervix, bilateral tubes  Description of the Procedure:  The patient was taken to the operating room, where general endotracheal anesthesia was obtained without difficulty. She was placed in the dorsal lithotomy position in [place] and exam under anesthesia was performed, and an anteverted, mobile fibroid uterus was noted. The patient was prepped and draped in the normal sterile fashion. A Foley catheter was inserted sterilely into the bladder. A bivalve speculum was placed into the vagina and acorn uterine manipulator was placed without difficulty. Attention was then turned to the abdomen. The scalp was used to make a 10 mm vertical incision at the base of the umbilicus. The 10/11 mm Optiview trocar was used to enter the abdomen under direct visualization. Entry was confirmed and the abdomen was insufflated with carbon dioxide. The abdomen was surveyed and findings noted as above. Under direct visualization, two additional 5-mm ports were placed into the right and left lower quadrant. The uterus was elevated out of the pelvis.    The left and right ureters were identified. There were filmy adhesions of the left colon to the left adnexa which were taken down easily with sharp and blunt dissection.  The LigaSure was then introduced into the  abdomen.  The left fallopian tube was grasped and the LigaSure was used to sequentially clamp, cut, and burn the mesosalpinx. The round ligament and utero-ovarian were then taken in a similar fashion. The anterior leaf of the broad ligament was developed with the LigaSure and dissected down to the level of the bladder. The posterior leaf was taken in a similar manner.  The uterine arteries were taken at the level of the internal cervical os.  A bladder flap was created with sharp dissection with the scissors.  Attention was then turned to the right side. In a similar manner, the LigaSure was used to sequentially clamp, burn the mesosalpinx, freeing up the right fallopian tube.  The round ligament and then the right utero-ovarian were clamped, cut and burned with the LigaSure.   The broad ligament was developed down to level of the uterine arteries, which were sequentially burned and cut with the LigaSure.    At this point, the decision was made to turn the procedure to a vaginal approach.  All instruments were removed from the abdomen. Attention was turned to the vagina. The acorn uterine manipulator was removed. The cervix was grasped with a single tooth tenaculum and injected circumferentially with dilute pitressin. Bovie cautery was used to circumferentially incise the cervicovaginal junction. The posterior cul-de-sac was entered into with Mayo scissors. The long billed duckbill retractor was then placed into the peritoneal cavity. The the uterosacrals were grasped with a pair heney clamps on either side and secured with a Heaney stitch of 0 Vicryl. The bladder was carefully dissected off of the cervix withsharp dissection with the Metzenbaums.   Curved Heaneys were used  to sequentially clamp, cut, and suture ligate, working up the cardinal ligaments until entry could be gained anteriorly into the peritoneum. At the level of the cornua,Heaneys were placed bilaterally around the entire pedicle and the  uterus was able to be amputated. The pedicles were secured with a free tie of 0 vicryl.  There was noted to be bleeding a small vessel on the right pedicle.  A figure of eight stitch of 2-0 vicryl was used to obtain hemostasis.  The peritoneum was then closed in a pursestring fashion, including the bilateral uterosacrals and the posterior vagina in a modified McCall's culdoplasty.  The stitch was pulled taut closing the peritoneum and supporting the vaginal cuff.  The vaginal cuff was closed with#0 Vicryl in a running, locked fashion.    Attention was then turned back to the abdomen. The camera was reinserted. The abdomen was surveyed. It was copiously irrigated.  The pedicles on the left and right side were examined and noted to be hemostatic. The vaginal cuff was re-examined. The ureters were examined bilaterally and noted to have active peristalsis.  At this time, all instruments and trocars were removed from the abdomen.  The fascia at the umbilicus was closed with 0-vicryl in a figure of eight stitch. The umbilical skin incision was closed with a 4-0 Monocryl in a subcuticular fashion followed by Dermabond.  The two accessory ports were closed with Dermabond The patient tolerated all portions of procedure well. Sponge, lap, and needle count were correct x2.

## 2017-05-11 NOTE — Anesthesia Postprocedure Evaluation (Signed)
Anesthesia Post Note  Patient: APURVA REILY  Procedure(s) Performed: Procedure(s) (LRB): LAPAROSCOPIC ASSISTED VAGINAL HYSTERECTOMY WITH SALPINGECTOMY (Bilateral)     Patient location during evaluation: PACU Anesthesia Type: General Level of consciousness: awake and alert Pain management: pain level controlled Vital Signs Assessment: post-procedure vital signs reviewed and stable Respiratory status: spontaneous breathing, nonlabored ventilation, respiratory function stable and patient connected to nasal cannula oxygen Cardiovascular status: blood pressure returned to baseline and stable Postop Assessment: no signs of nausea or vomiting Anesthetic complications: no    Last Vitals:  Vitals:   05/11/17 1401 05/11/17 1628  BP: 138/76 139/85  Pulse: 98 77  Resp: 16 16  Temp: 36.5 C 36.9 C    Last Pain:  Vitals:   05/11/17 1628  TempSrc: Oral  PainSc:    Pain Goal: Patients Stated Pain Goal: 3 (05/11/17 1400)               Skylynn Burkley EDWARD

## 2017-05-11 NOTE — Brief Op Note (Signed)
05/11/2017  11:01 AM  PATIENT:  Destiny Butler  41 y.o. female  PRE-OPERATIVE DIAGNOSIS:  FIBROIDS, HEAVY MENSTRUAL BLEEDING  POST-OPERATIVE DIAGNOSIS:  FIBROIDS, HEAVY MENSTRUAL BLEEDING  PROCEDURE:  Procedure(s): LAPAROSCOPIC ASSISTED VAGINAL HYSTERECTOMY WITH SALPINGECTOMY (Bilateral)  SURGEON:  Surgeon(s) and Role:    Jerelyn Charles, MD - Primary    * Bobbye Charleston, MD - Assisting  ANESTHESIA:   general  EBL:  Total I/O In: 2000 [I.V.:2000] Out: 425 [Urine:125; Blood:300]  BLOOD ADMINISTERED:none  DRAINS: none   LOCAL MEDICATIONS USED:  MARCAINE, VASOPRESSIN  SPECIMEN:  Source of Specimen:  uterus, cervix and bilateral tubes  DISPOSITION OF SPECIMEN:  PATHOLOGY  COUNTS:  YES  TOURNIQUET:  * No tourniquets in log *  DICTATION: .Note written in [place]: Admit for overnight observation  PATIENT DISPOSITION:  PACU - hemodynamically stable.   Delay start of Pharmacological VTE agent (>24hrs) due to surgical blood loss or risk of bleeding: not applicable

## 2017-05-11 NOTE — Anesthesia Postprocedure Evaluation (Signed)
Anesthesia Post Note  Patient: Destiny Butler  Procedure(s) Performed: Procedure(s) (LRB): LAPAROSCOPIC ASSISTED VAGINAL HYSTERECTOMY WITH SALPINGECTOMY (Bilateral)     Patient location during evaluation: [facility] Anesthesia Type: General Level of consciousness: awake Pain management: satisfactory to patient Vital Signs Assessment: post-procedure vital signs reviewed and stable Respiratory status: spontaneous breathing Cardiovascular status: stable Anesthetic complications: no    Last Vitals:  Vitals:   05/11/17 1401 05/11/17 1628  BP: 138/76 139/85  Pulse: 98 77  Resp: 16 16  Temp: 36.5 C 36.9 C    Last Pain:  Vitals:   05/11/17 1700  TempSrc:   PainSc: 5    Pain Goal: Patients Stated Pain Goal: 3 (05/11/17 1400)               Casimer Lanius

## 2017-05-11 NOTE — Anesthesia Procedure Notes (Addendum)
Procedure Name: Intubation Date/Time: 05/11/2017 8:44 AM Performed by: Flossie Dibble Pre-anesthesia Checklist: Patient identified, Patient being monitored, Timeout performed, Emergency Drugs available and Suction available Patient Re-evaluated:Patient Re-evaluated prior to inductionOxygen Delivery Method: Circle System Utilized Preoxygenation: Pre-oxygenation with 100% oxygen Intubation Type: IV induction Ventilation: Mask ventilation without difficulty Laryngoscope Size: Mac and 3 Grade View: Grade II Tube type: Oral Tube size: 7.0 mm Number of attempts: 1 Airway Equipment and Method: stylet Placement Confirmation: ETT inserted through vocal cords under direct vision,  positive ETCO2 and breath sounds checked- equal and bilateral Secured at: 21 cm Tube secured with: Tape Dental Injury: Teeth and Oropharynx as per pre-operative assessment

## 2017-05-11 NOTE — Transfer of Care (Signed)
Immediate Anesthesia Transfer of Care Note  Patient: Destiny Butler  Procedure(s) Performed: Procedure(s): LAPAROSCOPIC ASSISTED VAGINAL HYSTERECTOMY WITH SALPINGECTOMY (Bilateral)  Patient Location: PACU  Anesthesia Type:General  Level of Consciousness: awake, alert  and oriented  Airway & Oxygen Therapy: Patient Spontanous Breathing and Patient connected to nasal cannula oxygen  Post-op Assessment: Report given to RN and Post -op Vital signs reviewed and stable  Post vital signs: Reviewed and stable  Last Vitals:  Vitals:   05/11/17 0725  BP: 129/89  Pulse: 95  Resp: 16  Temp: 36.8 C    Last Pain:  Vitals:   05/11/17 0725  TempSrc: Oral      Patients Stated Pain Goal: 3 (67/89/38 1017)  Complications: No apparent anesthesia complications

## 2017-05-12 ENCOUNTER — Encounter (HOSPITAL_COMMUNITY): Payer: Self-pay | Admitting: Obstetrics

## 2017-05-12 DIAGNOSIS — Z79899 Other long term (current) drug therapy: Secondary | ICD-10-CM | POA: Diagnosis not present

## 2017-05-12 DIAGNOSIS — D251 Intramural leiomyoma of uterus: Secondary | ICD-10-CM | POA: Diagnosis not present

## 2017-05-12 DIAGNOSIS — D252 Subserosal leiomyoma of uterus: Secondary | ICD-10-CM | POA: Diagnosis not present

## 2017-05-12 LAB — BASIC METABOLIC PANEL
Anion gap: 6 (ref 5–15)
BUN: 5 mg/dL — ABNORMAL LOW (ref 6–20)
CO2: 26 mmol/L (ref 22–32)
Calcium: 9.1 mg/dL (ref 8.9–10.3)
Chloride: 105 mmol/L (ref 101–111)
Creatinine, Ser: 0.64 mg/dL (ref 0.44–1.00)
GFR calc Af Amer: >60 mL/min (ref >60)
GFR calc non Af Amer: >60 mL/min (ref >60)
Glucose, Bld: 115 mg/dL — ABNORMAL HIGH (ref 65–99)
Potassium: 3.8 mmol/L (ref 3.5–5.1)
Sodium: 137 mmol/L (ref 135–145)

## 2017-05-12 LAB — CBC
HCT: 39 % (ref 36.0–46.0)
Hemoglobin: 13.3 g/dL (ref 12.0–15.0)
MCH: 30.9 pg (ref 26.0–34.0)
MCHC: 34.1 g/dL (ref 30.0–36.0)
MCV: 90.7 fL (ref 78.0–100.0)
Platelets: 194 10*3/uL (ref 150–400)
RBC: 4.3 MIL/uL (ref 3.87–5.11)
RDW: 12.7 % (ref 11.5–15.5)
WBC: 13.1 10*3/uL — ABNORMAL HIGH (ref 4.0–10.5)

## 2017-05-12 MED ORDER — OXYCODONE HCL 5 MG PO TABS: 5.0000 mg | ORAL_TABLET | Freq: Four times a day (QID) | ORAL | 0 refills | Status: DC | PRN

## 2017-05-12 MED ORDER — IBUPROFEN 600 MG PO TABS: 600.0000 mg | ORAL_TABLET | Freq: Four times a day (QID) | ORAL | 0 refills | Status: AC

## 2017-05-12 NOTE — Discharge Summary (Signed)
  41 y.o. [id number] presents LAVH and bilateral salpingectomy for menorrhagia and uterine fibroids.    She has a two year history of heavy menstrual bleeding.  It was controlled initially by OCPs, but in Nov 2017 she required admission to [facility] for symptomatic ABLA and blood transfusion despite OCP use.  Largest fibroid is an anterior 5 cm intramural fibroid and desires definitive surgical management.  Bleeding preop has been controlled w PO provera  Patient was taken to the OR for an LAVH bilateral salpingectomy.  EBL was 300cc.  She was transferred to the floor.  On post op day #1, she was ambulating, voiding without difficulty, tolerating a PO diet without nausea or vomiting.  Pain was controlled by PO pain meds.  She was afebrile with stable vital signs.  Abdominal exam was benign.  She was meeting criteria for discharge to home.    F/u 2 Dr. Carlis Abbott in 2 weeks for excision check  D/C meds:  Ibuprofen and tylenol q6h.  Oxycodone q6h prn severe pain.

## 2017-05-12 NOTE — Discharge Instructions (Signed)
You may wash incisions with soap and water.   Do not soak the incision for 2 weeks (no tub baths or swimming).    Keep incisions dry.  If you note drainage, increased pain, or increased redness of the incision, then please notify your physician.  Pelvic rest x 6 weeks (no intercourse or tampons)   No lifting over 10 lbs for 6 weeks.   Please take motrin and tylenol every 6 hours.  If your pain is not controlled, you may take an oxycodone every 6 hours for severe pain.   Do not drive until you are not taking narcotic pain medication AND you can comfortably slam on the brakes.  Please call MD if you have a temperature >101F, nausea or vomiting, dizziness, pain not controlled with pain medications by mouth, heavy vaginal bleeding or any other concerns.

## 2017-05-12 NOTE — Progress Notes (Signed)
Doing well.  Back pain improved.  Pain controlled w PO meds.  Foley catheter out--voiding and ambulating well.  No dizziness.  Tolerating PO without n/v.  Minimal vaginal bleeding  BP 119/80 (BP Location: Right Arm)   Pulse 82   Temp 98.3 F (36.8 C) (Oral)   Resp 18   Ht 5\' 3"  (1.6 m)   Wt 79.8 kg (176 lb)   LMP 04/15/2017 (Approximate)   SpO2 99%   BMI 31.18 kg/m   NAD Abd: soft, app TTP.  Incisions c/d/i Pelvic: scant heme on pad Ext: no edema  A&P: POD#1 s/p LAVH/ b/l salpingectomy for HMB/fibroids Meeting all goals D/c to home today

## 2017-05-12 NOTE — Progress Notes (Signed)
Pt . Is discharged in the care of Mother. Downstairs per ambulatory with N.T. Escort.. Denies any pain or discomfort. Spirits are good Abdominal lapsites are clean and dry. Understands all discharged instructions well Questions asked and answered.Stable.

## 2017-12-20 DIAGNOSIS — Z1331 Encounter for screening for depression: Secondary | ICD-10-CM | POA: Diagnosis not present

## 2017-12-20 DIAGNOSIS — Z0000 Encounter for general adult medical examination without abnormal findings: Secondary | ICD-10-CM | POA: Diagnosis not present

## 2017-12-20 DIAGNOSIS — Z1339 Encounter for screening examination for other mental health and behavioral disorders: Secondary | ICD-10-CM | POA: Diagnosis not present

## 2017-12-20 DIAGNOSIS — R3989 Other symptoms and signs involving the genitourinary system: Secondary | ICD-10-CM | POA: Diagnosis not present

## 2017-12-21 DIAGNOSIS — R3989 Other symptoms and signs involving the genitourinary system: Secondary | ICD-10-CM | POA: Diagnosis not present

## 2017-12-22 DIAGNOSIS — Z6829 Body mass index (BMI) 29.0-29.9, adult: Secondary | ICD-10-CM | POA: Diagnosis not present

## 2017-12-22 DIAGNOSIS — N930 Postcoital and contact bleeding: Secondary | ICD-10-CM | POA: Diagnosis not present

## 2018-06-27 DIAGNOSIS — R8290 Unspecified abnormal findings in urine: Secondary | ICD-10-CM | POA: Diagnosis not present

## 2018-06-27 DIAGNOSIS — Z113 Encounter for screening for infections with a predominantly sexual mode of transmission: Secondary | ICD-10-CM | POA: Diagnosis not present

## 2018-06-27 DIAGNOSIS — B373 Candidiasis of vulva and vagina: Secondary | ICD-10-CM | POA: Diagnosis not present

## 2018-06-27 DIAGNOSIS — Z6830 Body mass index (BMI) 30.0-30.9, adult: Secondary | ICD-10-CM | POA: Diagnosis not present

## 2018-06-27 DIAGNOSIS — R102 Pelvic and perineal pain: Secondary | ICD-10-CM | POA: Diagnosis not present

## 2018-12-13 DIAGNOSIS — Z0000 Encounter for general adult medical examination without abnormal findings: Secondary | ICD-10-CM | POA: Diagnosis not present

## 2018-12-13 DIAGNOSIS — Z13220 Encounter for screening for lipoid disorders: Secondary | ICD-10-CM | POA: Diagnosis not present

## 2018-12-13 DIAGNOSIS — R5383 Other fatigue: Secondary | ICD-10-CM | POA: Diagnosis not present

## 2018-12-13 DIAGNOSIS — Z0001 Encounter for general adult medical examination with abnormal findings: Secondary | ICD-10-CM | POA: Diagnosis not present

## 2018-12-13 DIAGNOSIS — Z131 Encounter for screening for diabetes mellitus: Secondary | ICD-10-CM | POA: Diagnosis not present

## 2018-12-13 DIAGNOSIS — R209 Unspecified disturbances of skin sensation: Secondary | ICD-10-CM | POA: Diagnosis not present

## 2018-12-13 DIAGNOSIS — Z9289 Personal history of other medical treatment: Secondary | ICD-10-CM | POA: Diagnosis not present

## 2018-12-13 DIAGNOSIS — Z1331 Encounter for screening for depression: Secondary | ICD-10-CM | POA: Diagnosis not present

## 2018-12-13 DIAGNOSIS — R0683 Snoring: Secondary | ICD-10-CM | POA: Diagnosis not present

## 2018-12-13 DIAGNOSIS — R51 Headache: Secondary | ICD-10-CM | POA: Diagnosis not present

## 2018-12-18 DIAGNOSIS — E538 Deficiency of other specified B group vitamins: Secondary | ICD-10-CM | POA: Diagnosis not present

## 2019-03-22 DIAGNOSIS — R400 Somnolence: Secondary | ICD-10-CM | POA: Diagnosis not present

## 2019-06-20 ENCOUNTER — Encounter: Payer: Self-pay | Admitting: Gastroenterology

## 2019-07-19 DIAGNOSIS — Z1231 Encounter for screening mammogram for malignant neoplasm of breast: Secondary | ICD-10-CM | POA: Diagnosis not present

## 2019-07-19 DIAGNOSIS — Z1272 Encounter for screening for malignant neoplasm of vagina: Secondary | ICD-10-CM | POA: Diagnosis not present

## 2019-07-19 DIAGNOSIS — Z6831 Body mass index (BMI) 31.0-31.9, adult: Secondary | ICD-10-CM | POA: Diagnosis not present

## 2019-07-19 DIAGNOSIS — Z01419 Encounter for gynecological examination (general) (routine) without abnormal findings: Secondary | ICD-10-CM | POA: Diagnosis not present

## 2019-07-19 DIAGNOSIS — N760 Acute vaginitis: Secondary | ICD-10-CM | POA: Diagnosis not present

## 2019-09-10 DIAGNOSIS — E669 Obesity, unspecified: Secondary | ICD-10-CM | POA: Diagnosis not present

## 2019-09-10 DIAGNOSIS — Z1331 Encounter for screening for depression: Secondary | ICD-10-CM | POA: Diagnosis not present

## 2019-09-10 DIAGNOSIS — E538 Deficiency of other specified B group vitamins: Secondary | ICD-10-CM | POA: Diagnosis not present

## 2019-09-10 DIAGNOSIS — Z6830 Body mass index (BMI) 30.0-30.9, adult: Secondary | ICD-10-CM | POA: Diagnosis not present

## 2019-09-10 DIAGNOSIS — Z0000 Encounter for general adult medical examination without abnormal findings: Secondary | ICD-10-CM | POA: Diagnosis not present

## 2019-10-22 DIAGNOSIS — R7989 Other specified abnormal findings of blood chemistry: Secondary | ICD-10-CM | POA: Diagnosis not present

## 2020-03-26 DIAGNOSIS — Z8614 Personal history of Methicillin resistant Staphylococcus aureus infection: Secondary | ICD-10-CM | POA: Diagnosis not present

## 2020-03-26 DIAGNOSIS — J3489 Other specified disorders of nose and nasal sinuses: Secondary | ICD-10-CM | POA: Diagnosis not present

## 2020-09-09 DIAGNOSIS — E785 Hyperlipidemia, unspecified: Secondary | ICD-10-CM | POA: Diagnosis not present

## 2020-09-09 DIAGNOSIS — Z1331 Encounter for screening for depression: Secondary | ICD-10-CM | POA: Diagnosis not present

## 2020-09-09 DIAGNOSIS — Z1231 Encounter for screening mammogram for malignant neoplasm of breast: Secondary | ICD-10-CM | POA: Diagnosis not present

## 2020-09-09 DIAGNOSIS — Z0001 Encounter for general adult medical examination with abnormal findings: Secondary | ICD-10-CM | POA: Diagnosis not present

## 2020-09-09 DIAGNOSIS — E538 Deficiency of other specified B group vitamins: Secondary | ICD-10-CM | POA: Diagnosis not present

## 2020-09-09 DIAGNOSIS — Z131 Encounter for screening for diabetes mellitus: Secondary | ICD-10-CM | POA: Diagnosis not present

## 2020-09-09 DIAGNOSIS — Z6831 Body mass index (BMI) 31.0-31.9, adult: Secondary | ICD-10-CM | POA: Diagnosis not present

## 2020-09-09 DIAGNOSIS — L708 Other acne: Secondary | ICD-10-CM | POA: Diagnosis not present

## 2020-11-19 DIAGNOSIS — N898 Other specified noninflammatory disorders of vagina: Secondary | ICD-10-CM | POA: Diagnosis not present

## 2020-11-19 DIAGNOSIS — Z113 Encounter for screening for infections with a predominantly sexual mode of transmission: Secondary | ICD-10-CM | POA: Diagnosis not present

## 2020-11-19 DIAGNOSIS — Z1231 Encounter for screening mammogram for malignant neoplasm of breast: Secondary | ICD-10-CM | POA: Diagnosis not present

## 2020-11-19 DIAGNOSIS — Z01419 Encounter for gynecological examination (general) (routine) without abnormal findings: Secondary | ICD-10-CM | POA: Diagnosis not present

## 2020-11-20 ENCOUNTER — Other Ambulatory Visit: Payer: Self-pay | Admitting: Obstetrics

## 2020-11-20 DIAGNOSIS — R928 Other abnormal and inconclusive findings on diagnostic imaging of breast: Secondary | ICD-10-CM

## 2020-12-03 ENCOUNTER — Other Ambulatory Visit: Payer: Self-pay

## 2020-12-08 ENCOUNTER — Other Ambulatory Visit: Payer: Self-pay

## 2020-12-08 ENCOUNTER — Ambulatory Visit
Admission: RE | Admit: 2020-12-08 | Discharge: 2020-12-08 | Disposition: A | Discharge status: Home / Self Care | Payer: BC Managed Care – PPO | Source: Ambulatory Visit | Attending: Obstetrics | Admitting: Obstetrics

## 2020-12-08 ENCOUNTER — Ambulatory Visit
Admission: RE | Admit: 2020-12-08 | Discharge: 2020-12-08 | Disposition: A | Discharge status: Home / Self Care | Payer: Self-pay | Source: Ambulatory Visit | Attending: Obstetrics | Admitting: Obstetrics

## 2020-12-08 DIAGNOSIS — Z8614 Personal history of Methicillin resistant Staphylococcus aureus infection: Secondary | ICD-10-CM | POA: Diagnosis not present

## 2020-12-08 DIAGNOSIS — J3489 Other specified disorders of nose and nasal sinuses: Secondary | ICD-10-CM | POA: Diagnosis not present

## 2020-12-08 DIAGNOSIS — N6489 Other specified disorders of breast: Secondary | ICD-10-CM | POA: Diagnosis not present

## 2020-12-08 DIAGNOSIS — R928 Other abnormal and inconclusive findings on diagnostic imaging of breast: Secondary | ICD-10-CM

## 2020-12-08 DIAGNOSIS — R21 Rash and other nonspecific skin eruption: Secondary | ICD-10-CM | POA: Diagnosis not present

## 2020-12-08 DIAGNOSIS — R922 Inconclusive mammogram: Secondary | ICD-10-CM | POA: Diagnosis not present

## 2021-02-25 DIAGNOSIS — Z6832 Body mass index (BMI) 32.0-32.9, adult: Secondary | ICD-10-CM | POA: Diagnosis not present

## 2021-02-25 DIAGNOSIS — J029 Acute pharyngitis, unspecified: Secondary | ICD-10-CM | POA: Diagnosis not present

## 2021-02-25 DIAGNOSIS — R6889 Other general symptoms and signs: Secondary | ICD-10-CM | POA: Diagnosis not present

## 2021-03-24 IMAGING — MG MM DIGITAL DIAGNOSTIC UNILAT*L* W/ TOMO W/ CAD
8 series · 8 of 24 positions shown · non-contrast
Comparison: Previous exam(s).

CLINICAL DATA: Patient recalled from screening for left breast
asymmetry.

EXAM:
DIGITAL DIAGNOSTIC UNILATERAL LEFT MAMMOGRAM WITH TOMOSYNTHESIS AND
CAD; ULTRASOUND LEFT BREAST LIMITED
TECHNIQUE: Left digital diagnostic mammography and breast tomosynthesis was
performed. The images were evaluated with computer-aided detection.;
Targeted ultrasound examination of the left breast was performed.

[L MLO synth-2D (1 of 3)]
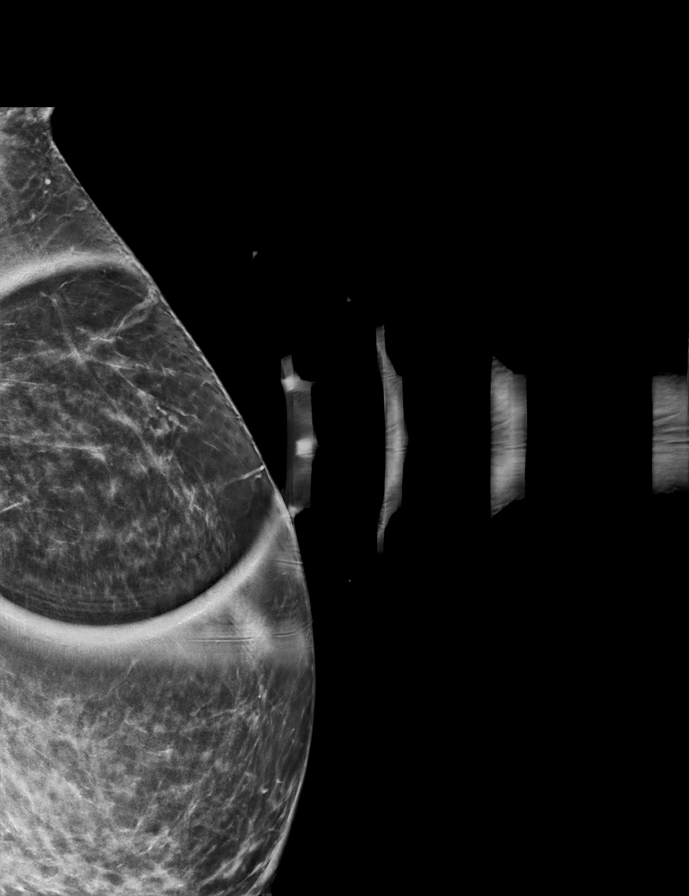

[L ML synth-2D]
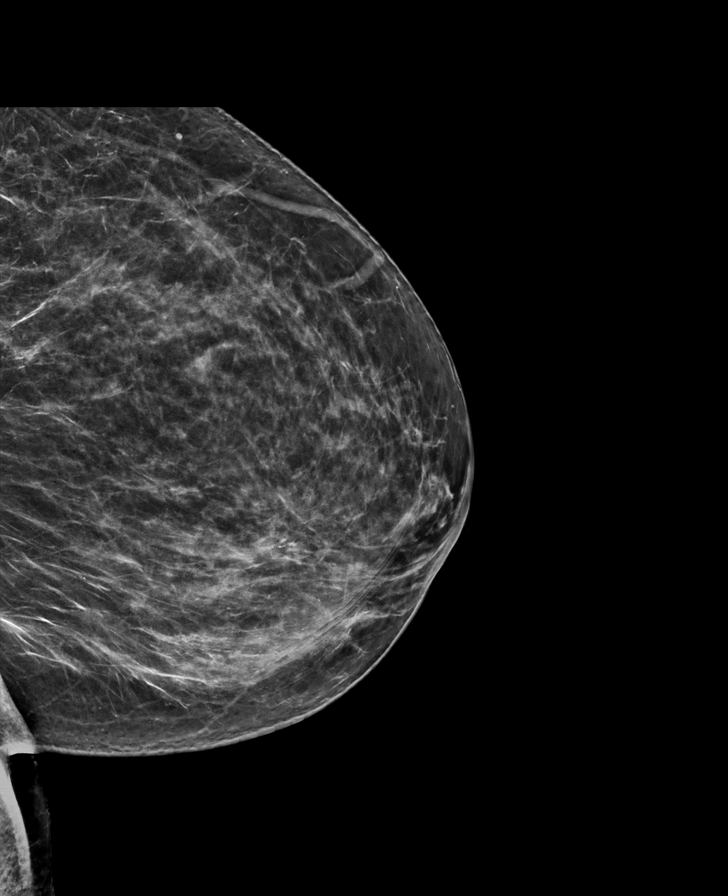

[L MLO synth-2D (2 of 3)]
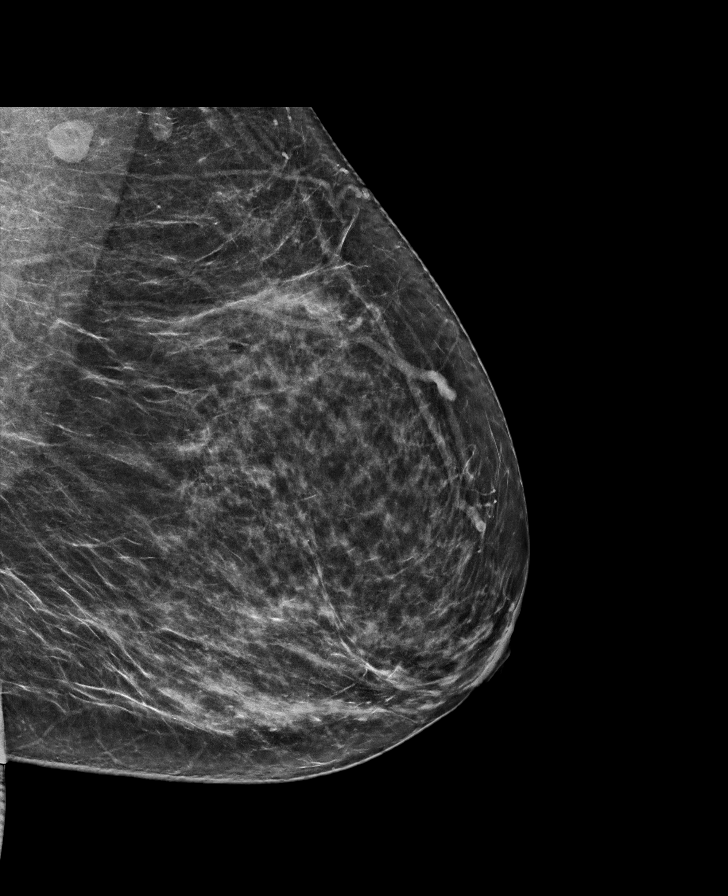

[L MLO synth-2D (3 of 3)]
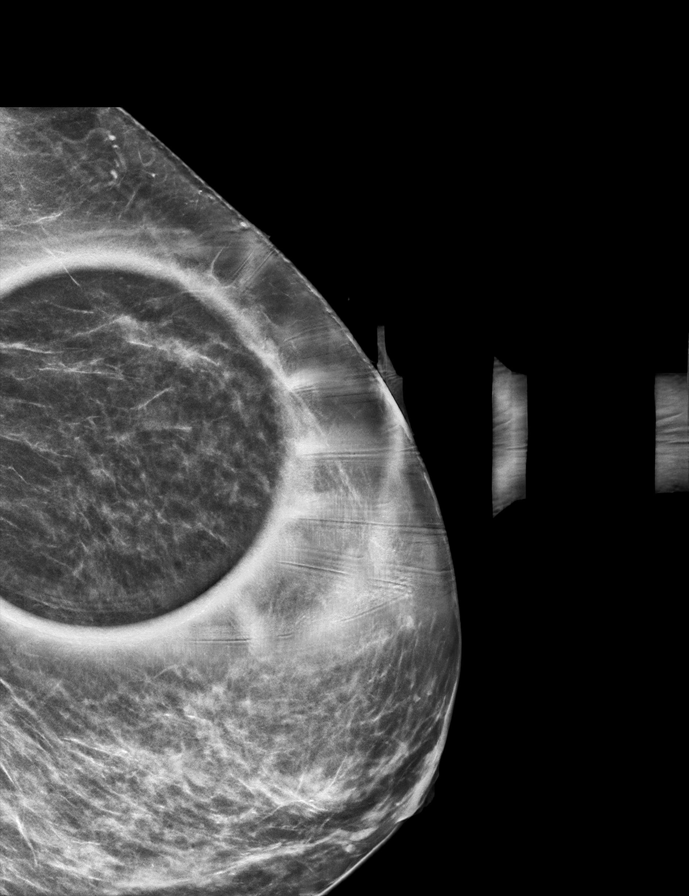

[L MLO tomo (1 of 3) · tomo slice 37/73.0]
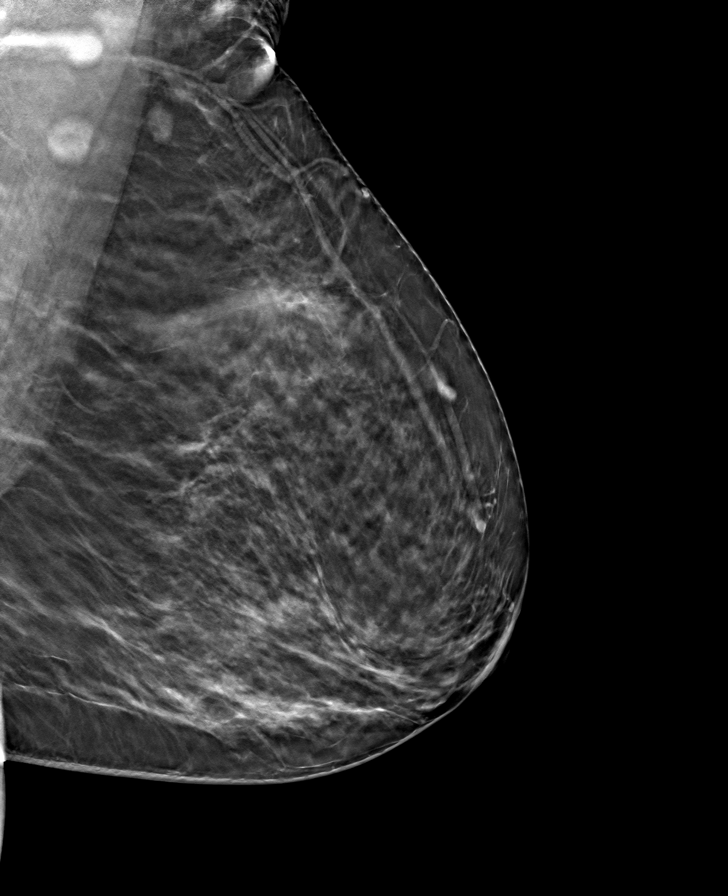

[L MLO tomo (2 of 3) · tomo slice 33/65.0]
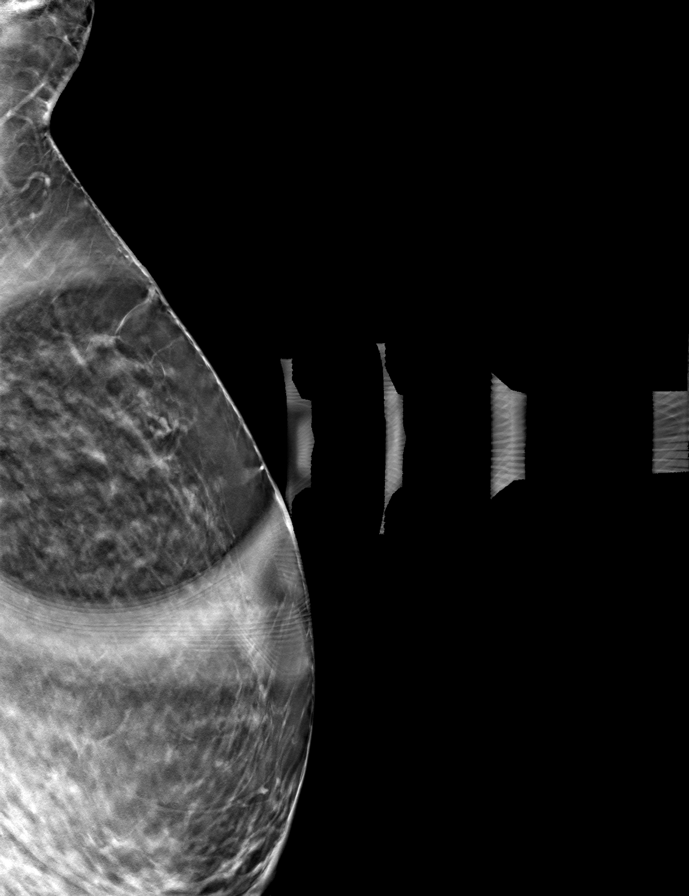

[L ML tomo · tomo slice 37/73.0]
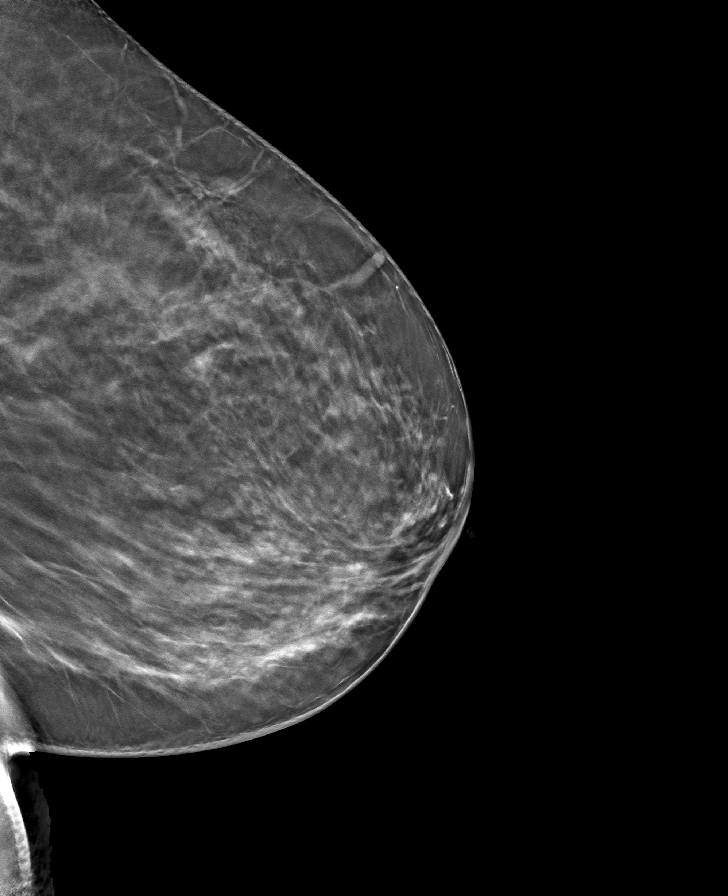

[L MLO tomo (3 of 3) · tomo slice 34/67.0]
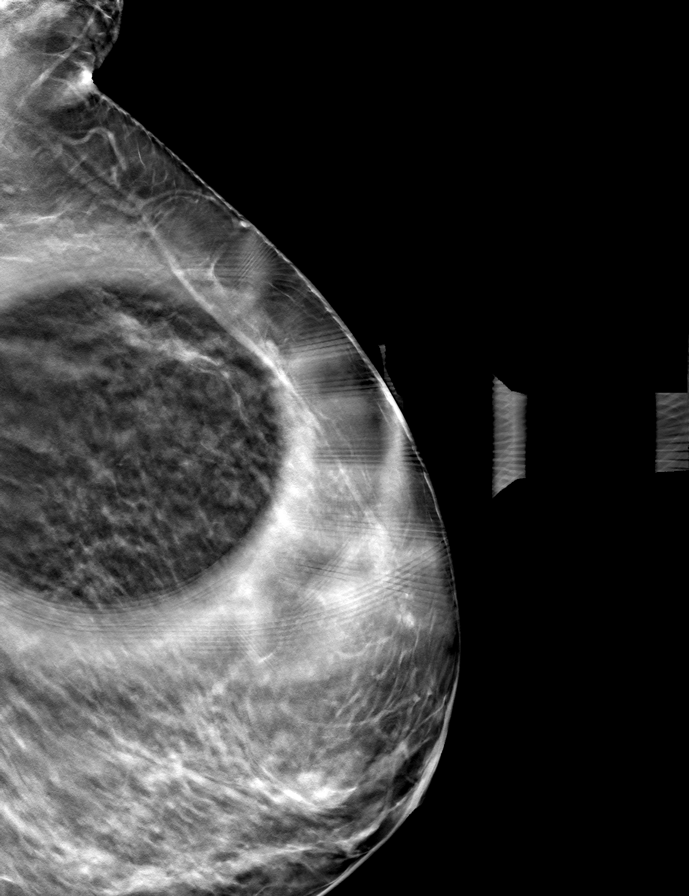

[8 of 24 positions shown; findings below may reference images not displayed]

ACR Breast Density Category c: The breast tissue is heterogeneously
dense, which may obscure small masses.
FINDINGS: Questioned asymmetry within the upper-outer left breast partially
resolved with additional imaging suggestive of dense fibroglandular
tissue

Targeted ultrasound is performed, showing dense tissue without
suspicious mass upper outer left breast.
IMPRESSION: No mammographic evidence for malignancy.

RECOMMENDATION:
Screening mammogram in one year.(Code:S4-M-HSR)

I have discussed the findings and recommendations with the patient.
If applicable, a reminder letter will be sent to the patient
regarding the next appointment.

BI-RADS CATEGORY  2: Benign.

## 2021-05-13 DIAGNOSIS — R0789 Other chest pain: Secondary | ICD-10-CM | POA: Diagnosis not present

## 2021-05-13 DIAGNOSIS — R079 Chest pain, unspecified: Secondary | ICD-10-CM | POA: Diagnosis not present

## 2021-05-13 DIAGNOSIS — Z20822 Contact with and (suspected) exposure to covid-19: Secondary | ICD-10-CM | POA: Diagnosis not present

## 2021-08-02 DIAGNOSIS — Z20822 Contact with and (suspected) exposure to covid-19: Secondary | ICD-10-CM | POA: Diagnosis not present

## 2021-09-09 DIAGNOSIS — E785 Hyperlipidemia, unspecified: Secondary | ICD-10-CM | POA: Diagnosis not present

## 2021-09-09 DIAGNOSIS — Z1331 Encounter for screening for depression: Secondary | ICD-10-CM | POA: Diagnosis not present

## 2021-09-09 DIAGNOSIS — Z131 Encounter for screening for diabetes mellitus: Secondary | ICD-10-CM | POA: Diagnosis not present

## 2021-09-09 DIAGNOSIS — Z1231 Encounter for screening mammogram for malignant neoplasm of breast: Secondary | ICD-10-CM | POA: Diagnosis not present

## 2021-09-09 DIAGNOSIS — Z0000 Encounter for general adult medical examination without abnormal findings: Secondary | ICD-10-CM | POA: Diagnosis not present

## 2021-09-09 DIAGNOSIS — Z0001 Encounter for general adult medical examination with abnormal findings: Secondary | ICD-10-CM | POA: Diagnosis not present

## 2021-09-09 DIAGNOSIS — E2839 Other primary ovarian failure: Secondary | ICD-10-CM | POA: Diagnosis not present

## 2021-09-21 DIAGNOSIS — E042 Nontoxic multinodular goiter: Secondary | ICD-10-CM | POA: Diagnosis not present

## 2021-09-21 DIAGNOSIS — E041 Nontoxic single thyroid nodule: Secondary | ICD-10-CM | POA: Diagnosis not present

## 2021-12-14 DIAGNOSIS — Z6831 Body mass index (BMI) 31.0-31.9, adult: Secondary | ICD-10-CM | POA: Diagnosis not present

## 2021-12-14 DIAGNOSIS — Z1231 Encounter for screening mammogram for malignant neoplasm of breast: Secondary | ICD-10-CM | POA: Diagnosis not present

## 2021-12-14 DIAGNOSIS — Z01419 Encounter for gynecological examination (general) (routine) without abnormal findings: Secondary | ICD-10-CM | POA: Diagnosis not present

## 2021-12-14 DIAGNOSIS — Z113 Encounter for screening for infections with a predominantly sexual mode of transmission: Secondary | ICD-10-CM | POA: Diagnosis not present

## 2022-09-30 DIAGNOSIS — R197 Diarrhea, unspecified: Secondary | ICD-10-CM | POA: Diagnosis not present

## 2022-09-30 DIAGNOSIS — Z0000 Encounter for general adult medical examination without abnormal findings: Secondary | ICD-10-CM | POA: Diagnosis not present

## 2022-09-30 DIAGNOSIS — E042 Nontoxic multinodular goiter: Secondary | ICD-10-CM | POA: Diagnosis not present

## 2022-09-30 DIAGNOSIS — R12 Heartburn: Secondary | ICD-10-CM | POA: Diagnosis not present

## 2022-09-30 DIAGNOSIS — Z6831 Body mass index (BMI) 31.0-31.9, adult: Secondary | ICD-10-CM | POA: Diagnosis not present

## 2022-09-30 DIAGNOSIS — E538 Deficiency of other specified B group vitamins: Secondary | ICD-10-CM | POA: Diagnosis not present

## 2022-09-30 DIAGNOSIS — Z1331 Encounter for screening for depression: Secondary | ICD-10-CM | POA: Diagnosis not present

## 2022-11-09 ENCOUNTER — Encounter: Payer: Self-pay | Admitting: Internal Medicine

## 2022-11-22 ENCOUNTER — Encounter: Payer: Self-pay | Admitting: Gastroenterology

## 2022-12-21 DIAGNOSIS — L710 Perioral dermatitis: Secondary | ICD-10-CM | POA: Diagnosis not present

## 2022-12-21 DIAGNOSIS — Z6831 Body mass index (BMI) 31.0-31.9, adult: Secondary | ICD-10-CM | POA: Diagnosis not present

## 2022-12-21 DIAGNOSIS — R21 Rash and other nonspecific skin eruption: Secondary | ICD-10-CM | POA: Diagnosis not present

## 2022-12-21 DIAGNOSIS — J3489 Other specified disorders of nose and nasal sinuses: Secondary | ICD-10-CM | POA: Diagnosis not present

## 2022-12-24 ENCOUNTER — Encounter: Payer: Self-pay | Admitting: Gastroenterology

## 2022-12-24 ENCOUNTER — Ambulatory Visit: Payer: BC Managed Care – PPO | Admitting: Gastroenterology

## 2022-12-24 VITALS — BP 120/84 | HR 100 | Ht 63.0 in | Wt 177.5 lb

## 2022-12-24 DIAGNOSIS — Z1211 Encounter for screening for malignant neoplasm of colon: Secondary | ICD-10-CM | POA: Diagnosis not present

## 2022-12-24 DIAGNOSIS — Z1212 Encounter for screening for malignant neoplasm of rectum: Secondary | ICD-10-CM | POA: Diagnosis not present

## 2022-12-24 DIAGNOSIS — K219 Gastro-esophageal reflux disease without esophagitis: Secondary | ICD-10-CM | POA: Diagnosis not present

## 2022-12-24 NOTE — Patient Instructions (Addendum)
_______________________________________________________  If your blood pressure at your visit was 140/90 or greater, please contact your primary care physician to follow up on this.  _______________________________________________________  If you are age 46 or older, your body mass index should be between 23-30. Your Body mass index is 31.44 kg/m. If this is out of the aforementioned range listed, please consider follow up with your Primary Care Provider.  If you are age 62 or younger, your body mass index should be between 19-25. Your Body mass index is 31.44 kg/m. If this is out of the aformentioned range listed, please consider follow up with your Primary Care Provider.   ________________________________________________________  The [facility] providers would like to encourage you to use [facility] to communicate with providers for non-urgent requests or questions.  Due to long hold times on the telephone, sending your provider a message by [facility] may be a faster and more efficient way to get a response.  Please allow 48 business hours for a response.  Please remember that this is for non-urgent requests.  _______________________________________________________  Dennis Bast have been scheduled for an endoscopy and colonoscopy. Please follow the written instructions given to you at your visit today. Please pick up your prep supplies at the pharmacy within the next 1-3 days. If you use inhalers (even only as needed), please bring them with you on the day of your procedure.  Thank you,  Dr. Jackquline Denmark

## 2022-12-24 NOTE — Progress Notes (Addendum)
Chief Complaint: For GI evaluation  Referring Provider:  Ernestene Kiel, MD      ASSESSMENT AND PLAN;   #1. GERD  #2. CRC screening  Plan: -EGD/colon with miralax -Continue current diet and lifestyle changes.   I discussed EGD/Colonoscopy- the indications, risks, alternatives and potential complications including, but not limited to bleeding, infection, reaction to meds, damage to internal organs, cardiac and/or pulmonary problems, and perforation requiring surgery. The possibility that significant findings could be missed was explained. All ? were answered. Pt consents to proceed. HPI:    Destiny Butler is a 47 y.o. female   For GI evaluation.  Occ epi pain after eating. Occ heartburn which under good control with dietary changes.  She has stopped eating fried foods.  She has stopped eating late at night.  No odynophagia or dysphagia.  Has intermittent diarrhea ever since cholecystectomy several years ago.  No nocturnal symptoms.  Mostly have diarrhea after fried foods..  No melena or hematochezia.  No fever chills or night sweats.  Has been very careful with her diet now.  She would like to get endoscopy and colonoscopy done.  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  She was offered surgery at that time-which she declined.  Not having any problems after meals.  ID has resolved ever since she had hysterectomy.  Past GI workup: Colon 03/2016: ([facility])-minimal internal hemorrhoids.  Otherwise normal to TI. Neg random colon and TI biopsies for microscopic colitis EGD 01/2016: Mild gastritis, gastric polyps s/p snare polypectomy. 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 Otherwise normal colooscopy to terminal ileum.   ESOPHAGOGASTRODUODENOSCOPY  02/04/2016   Mild gastritis. Gastric polyp (status post polypectomy) Otherwise normal EGD   LAPAROSCOPIC VAGINAL HYSTERECTOMY WITH SALPINGECTOMY Bilateral 05/11/2017   Procedure: LAPAROSCOPIC ASSISTED VAGINAL HYSTERECTOMY WITH SALPINGECTOMY;  Surgeon: Jerelyn Charles, MD;  Location: [facility];  Service: Gynecology;  Laterality: Bilateral;   WISDOM TOOTH EXTRACTION      Family History  Problem Relation Age of Onset   Diabetes Mother        prediabetic   Hypertension Mother    Alcoholism Father    Hypertension Maternal Grandmother    Leukemia Maternal Aunt        great aunt   Cancer Paternal 87        female cancer type unknown    Social History   Tobacco Use   Smoking status: Never   Smokeless tobacco: Never  Vaping  Use   Vaping Use: Never used  Substance Use Topics   Alcohol use: Yes    Comment: Holidays   Drug use: Not Currently    Types: Marijuana    Comment: as a young person    Current Outpatient Medications  Medication Sig Dispense Refill   clotrimazole-betamethasone (LOTRISONE) cream Apply 1 Application topically 2 (two) times daily.     cyanocobalamin (VITAMIN B12) 1000 MCG/ML injection Inject 1,000 mcg into the muscle once a week.     doxycycline (VIBRAMYCIN) 100 MG capsule Take 100 mg by mouth 2 (two) times daily.     fexofenadine (ALLEGRA) 180 MG  tablet Take 180 mg by mouth daily as needed for allergies or rhinitis.     ibuprofen (ADVIL,MOTRIN) 600 MG tablet Take 1 tablet (600 mg total) by mouth every 6 (six) hours. 40 tablet 0   mupirocin ointment (BACTROBAN) 2 % Apply 1 Application topically 3 (three) times daily.     valACYclovir (VALTREX) 1000 MG tablet take 2 tablet by mouth twice a day as needed for onset of cold sore (Patient not taking: Reported on 12/24/2022)     No current facility-administered medications for this visit.    Allergies  Allergen Reactions   Sulfa Antibiotics Rash    Review of Systems:  Constitutional: Denies fever, chills, diaphoresis, appetite change and fatigue.  HEENT: Denies photophobia, eye pain, redness, hearing loss, ear pain, congestion, sore throat, rhinorrhea, sneezing, mouth sores, neck pain, neck stiffness and tinnitus.   Respiratory: Denies SOB, DOE, cough, chest tightness,  and wheezing.   Cardiovascular: Denies chest pain, palpitations and leg swelling.  Genitourinary: Denies dysuria, urgency, frequency, hematuria, flank pain and difficulty urinating.  Musculoskeletal: Denies myalgias, back pain, joint swelling, arthralgias and gait problem.  Skin: No rash.  Neurological: Denies dizziness, seizures, syncope, weakness, light-headedness, numbness and headaches.  Hematological: Denies adenopathy. Easy bruising, personal or family bleeding history  Psychiatric/Behavioral: No anxiety or depression     Physical Exam:    BP 120/84 (BP Location: Left Arm, Patient Position: Sitting, Cuff Size: Normal)   Pulse 100   Ht '5\' 3"'$  (1.6 m)   Wt 177 lb 8 oz (80.5 kg)   LMP 04/15/2017 (Approximate)   BMI 31.44 kg/m  Wt Readings from Last 3 Encounters:  12/24/22 177 lb 8 oz (80.5 kg)  05/11/17 176 lb (79.8 kg)  04/29/17 169 lb 4 oz (76.8 kg)   Constitutional:  Well-developed, in no acute distress. Psychiatric: Normal mood and affect. Behavior is normal. HEENT: Pupils normal.  Conjunctivae are  normal. No scleral icterus. Cardiovascular: Normal rate, regular rhythm. No edema Pulmonary/chest: Effort normal and breath sounds normal. No wheezing, rales or rhonchi. Abdominal: Soft, nondistended. Nontender. Bowel sounds active throughout. There are no masses palpable. No hepatomegaly. Rectal: Deferred Neurological: Alert and oriented to person place and time. Skin: Skin is warm and dry. No rashes noted.  Data Reviewed: I have personally reviewed following labs and imaging studies  CBC:    Latest Ref Rng & Units 05/12/2017    5:08 AM 04/29/2017   10:05 AM 09/17/2016    1:35 AM  CBC  WBC 4.0 - 10.5 K/uL 13.1  5.8  8.5   Hemoglobin 12.0 - 15.0 g/dL 13.3  15.4  7.4   Hematocrit 36.0 - 46.0 % 39.0  44.3  21.7   Platelets 150 - 400 K/uL 194  219  193     CMP:    Latest Ref Rng & Units 05/12/2017  5:08 AM 04/29/2017   10:05 AM  CMP  Glucose 65 - 99 mg/dL 115  94   BUN 6 - 20 mg/dL 5  7   Creatinine 0.44 - 1.00 mg/dL 0.64  0.68   Sodium 135 - 145 mmol/L 137  137   Potassium 3.5 - 5.1 mmol/L 3.8  3.7   Chloride 101 - 111 mmol/L 105  105   CO2 22 - 32 mmol/L 26  26   Calcium 8.9 - 10.3 mg/dL 9.1  9.3         Carmell Austria, MD 12/24/2022, 11:52 AM  Cc: Ernestene Kiel, MD

## 2023-01-10 DIAGNOSIS — Z113 Encounter for screening for infections with a predominantly sexual mode of transmission: Secondary | ICD-10-CM | POA: Diagnosis not present

## 2023-01-10 DIAGNOSIS — Z1231 Encounter for screening mammogram for malignant neoplasm of breast: Secondary | ICD-10-CM | POA: Diagnosis not present

## 2023-01-10 DIAGNOSIS — Z6832 Body mass index (BMI) 32.0-32.9, adult: Secondary | ICD-10-CM | POA: Diagnosis not present

## 2023-01-10 DIAGNOSIS — Z01419 Encounter for gynecological examination (general) (routine) without abnormal findings: Secondary | ICD-10-CM | POA: Diagnosis not present

## 2023-02-24 ENCOUNTER — Encounter: Payer: BC Managed Care – PPO | Admitting: Gastroenterology

## 2023-08-23 DIAGNOSIS — Z131 Encounter for screening for diabetes mellitus: Secondary | ICD-10-CM | POA: Diagnosis not present

## 2023-08-23 DIAGNOSIS — R209 Unspecified disturbances of skin sensation: Secondary | ICD-10-CM | POA: Diagnosis not present

## 2023-08-23 DIAGNOSIS — E063 Autoimmune thyroiditis: Secondary | ICD-10-CM | POA: Diagnosis not present

## 2023-08-23 DIAGNOSIS — R631 Polydipsia: Secondary | ICD-10-CM | POA: Diagnosis not present

## 2023-08-23 DIAGNOSIS — R350 Frequency of micturition: Secondary | ICD-10-CM | POA: Diagnosis not present

## 2023-08-23 DIAGNOSIS — Z862 Personal history of diseases of the blood and blood-forming organs and certain disorders involving the immune mechanism: Secondary | ICD-10-CM | POA: Diagnosis not present

## 2023-08-23 DIAGNOSIS — R5383 Other fatigue: Secondary | ICD-10-CM | POA: Diagnosis not present

## 2023-08-23 DIAGNOSIS — E538 Deficiency of other specified B group vitamins: Secondary | ICD-10-CM | POA: Diagnosis not present

## 2023-12-13 DIAGNOSIS — U071 COVID-19: Secondary | ICD-10-CM | POA: Diagnosis not present

## 2023-12-13 DIAGNOSIS — J029 Acute pharyngitis, unspecified: Secondary | ICD-10-CM | POA: Diagnosis not present

## 2023-12-13 DIAGNOSIS — R0981 Nasal congestion: Secondary | ICD-10-CM | POA: Diagnosis not present

## 2023-12-13 DIAGNOSIS — Z6833 Body mass index (BMI) 33.0-33.9, adult: Secondary | ICD-10-CM | POA: Diagnosis not present

## 2024-02-24 DIAGNOSIS — Z113 Encounter for screening for infections with a predominantly sexual mode of transmission: Secondary | ICD-10-CM | POA: Diagnosis not present

## 2024-02-24 DIAGNOSIS — Z1231 Encounter for screening mammogram for malignant neoplasm of breast: Secondary | ICD-10-CM | POA: Diagnosis not present

## 2024-02-24 DIAGNOSIS — Z01419 Encounter for gynecological examination (general) (routine) without abnormal findings: Secondary | ICD-10-CM | POA: Diagnosis not present

## 2024-04-02 ENCOUNTER — Ambulatory Visit: Admitting: "Endocrinology

## 2024-04-02 ENCOUNTER — Encounter: Payer: Self-pay | Admitting: "Endocrinology

## 2024-04-02 VITALS — BP 110/80 | HR 65 | Ht 63.0 in | Wt 181.0 lb

## 2024-04-02 DIAGNOSIS — E042 Nontoxic multinodular goiter: Secondary | ICD-10-CM | POA: Diagnosis not present

## 2024-04-02 NOTE — Progress Notes (Addendum)
 Outpatient Endocrinology Note Destiny Newcomer, MD  04/02/24   Destiny Butler 28-Nov-1975 [id number]  Referring Provider: Olan Bering, MD Primary Care Provider: Olan Bering, MD Subjective  No chief complaint on file.   Assessment & Plan  Diagnoses and all orders for this visit:  Multinodular goiter -     TSH -     T4, free -     US  THYROID ; Future -     US  THYROID    Gracey L Haney is currently not taking any thyroid  medication.  Educated on thyroid  axis.  Recommend the following: Ordered lab today. Counseled on compliance and follow up needs.  Multinodular goiter at least since 09/2021 Reviewed records, per report patient had at least 6 thyroid  nodules and enlarged right lobe back in 2022, all nodules were reported spongiform/mixed solid cystic TR1/TR2 and none meet the criteria for follow-up/FNA, the largest being 3.3 cm mixed solid cystic on right mid TR2. Recommend follow-up thyroid  ultrasound given mild dysphagia and continued goiter on exam  I have reviewed current medications, nurse's notes, allergies, vital signs, past medical and surgical history, family medical history, and social history for this encounter. Counseled patient on symptoms, examination findings, lab findings, imaging results, treatment decisions and monitoring and prognosis. The patient understood the recommendations and agrees with the treatment plan. All questions regarding treatment plan were fully answered.   Return in about 4 weeks (around 04/30/2024) for visit, labs today.   Destiny Newcomer, MD  04/02/24   I have reviewed current medications, nurse's notes, allergies, vital signs, past medical and surgical history, family medical history, and social history for this encounter. Counseled patient on symptoms, examination findings, lab findings, imaging results, treatment decisions and monitoring and prognosis. The patient understood the recommendations and agrees with the  treatment plan. All questions regarding treatment plan were fully answered.   History of Present Illness Destiny Butler is a 48 y.o. year old female who presents to our clinic with multinodular goiter diagnosed in 04/2024.    Never been on thyroid  medication   Symptoms suggestive of HYPOTHYROIDISM:  fatigue No weight gain No cold intolerance  No constipation  No  Symptoms suggestive of HYPERTHYROIDISM:  weight loss  No heat intolerance No, can get hot and sweaty  hyperdefecation  No palpitations  Yes, sometimes   Compressive symptoms:  dysphagia  Yes, sometimes  dysphonia  No positional dyspnea (especially with simultaneous arms elevation)  No  Smokes  No On biotin  No Personal history of head/neck surgery/irradiation  No         Physical Exam  BP 110/80   Pulse 65   Ht 5\' 3"  (1.6 m)   Wt 181 lb (82.1 kg)   LMP 04/15/2017 (Approximate)   SpO2 98%   BMI 32.06 kg/m  Constitutional: well developed, well nourished Head: normocephalic, atraumatic, no exophthalmos Eyes: sclera anicteric, no redness Neck: + thyromegaly, no thyroid  tenderness; + nodules  Lungs: normal respiratory effort Neurology: alert and oriented, no fine hand tremor Skin: dry, no appreciable rashes Musculoskeletal: no appreciable defects Psychiatric: normal mood and affect  Allergies Allergies  Allergen Reactions   Bee Venom Swelling   Sulfa Antibiotics Rash    Current Medications Patient's Medications  New Prescriptions   No medications on file  Previous Medications   CLOTRIMAZOLE-BETAMETHASONE (LOTRISONE) CREAM    Apply 1 Application topically 2 (two) times daily.   CYANOCOBALAMIN  (VITAMIN B12) 1000 MCG/ML INJECTION    Inject 1,000 mcg into the muscle once  a week.   DOXYCYCLINE (VIBRAMYCIN) 100 MG CAPSULE    Take 100 mg by mouth 2 (two) times daily.   FEXOFENADINE (ALLEGRA) 180 MG TABLET    Take 180 mg by mouth daily as needed for allergies or rhinitis.   IBUPROFEN   (ADVIL ,MOTRIN ) 600 MG TABLET    Take 1 tablet (600 mg total) by mouth every 6 (six) hours.   MUPIROCIN OINTMENT (BACTROBAN) 2 %    Apply 1 Application topically 3 (three) times daily.   VALACYCLOVIR (VALTREX) 1000 MG TABLET      Modified Medications   No medications on file  Discontinued Medications   No medications on file    Past Medical History Past Medical History:  Diagnosis Date   Anemia    Celiac artery compression syndrome (HCC)    pt told she needs to have surgery to fix    Cold sore    Fibroid    Headache    Migraines   Heart murmur    no problems   History of blood transfusion    HLD (hyperlipidemia)    Polyuria    Sleep apnea    SVD (spontaneous vaginal delivery)    x 1    Past Surgical History Past Surgical History:  Procedure Laterality Date   BREAST SURGERY Left    tissue removed under left arm   CESAREAN SECTION     x 1   CHOLECYSTECTOMY     COLONOSCOPY  03/08/2016   Minimal internal hemrrhoids. Otherwise normal colooscopy to terminal ileum.   ESOPHAGOGASTRODUODENOSCOPY  02/04/2016   Mild gastritis. Gastric polyp (status post polypectomy) Otherwise normal EGD   LAPAROSCOPIC VAGINAL HYSTERECTOMY WITH SALPINGECTOMY Bilateral 05/11/2017   Procedure: LAPAROSCOPIC ASSISTED VAGINAL HYSTERECTOMY WITH SALPINGECTOMY;  Surgeon: Luan Rumpf, MD;  Location: [facility];  Service: Gynecology;  Laterality: Bilateral;   WISDOM TOOTH EXTRACTION      Family History family history includes Alcoholism in her father; Cancer in her paternal aunt; Diabetes in her mother; Hypertension in her maternal grandmother and mother; Leukemia in her maternal aunt.  Social History Social History   Socioeconomic History   Marital status: Single    Spouse name: Not on file   Number of children: 2   Years of education: Not on file   Highest education level: Not on file  Occupational History   Occupation: payroll admin  Tobacco Use   Smoking status: Never   Smokeless tobacco:  Never  Vaping Use   Vaping status: Never Used  Substance and Sexual Activity   Alcohol use: Yes    Comment: Holidays   Drug use: Not Currently    Types: Marijuana    Comment: as a young person   Sexual activity: Not on file  Other Topics Concern   Not on file  Social History Narrative   Not on file   Social Drivers of Health   Financial Resource Strain: Not on file  Food Insecurity: Not on file  Transportation Needs: Not on file  Physical Activity: Not on file  Stress: Not on file  Social Connections: Not on file  Intimate Partner Violence: Not on file    Laboratory Investigations No results found for: "TSH", "FREET4"   No results found for: "TSI"   No components found for: "TRAB"   No results found for: "CHOL" No results found for: "HDL" No results found for: "LDLCALC" No results found for: "TRIG" No results found for: "CHOLHDL" Lab Results  Component Value Date   CREATININE 0.64 05/12/2017  No results found for: "GFR"    Component Value Date/Time   NA 137 05/12/2017 0508   K 3.8 05/12/2017 0508   CL 105 05/12/2017 0508   CO2 26 05/12/2017 0508   GLUCOSE 115 (H) 05/12/2017 0508   BUN 5 (L) 05/12/2017 0508   CREATININE 0.64 05/12/2017 0508   CALCIUM 9.1 05/12/2017 0508   GFRNONAA >60 05/12/2017 0508   GFRAA >60 05/12/2017 0508      Latest Ref Rng & Units 05/12/2017    5:08 AM 04/29/2017   10:05 AM  BMP  Glucose 65 - 99 mg/dL 409  94   BUN 6 - 20 mg/dL 5  7   Creatinine 8.11 - 1.00 mg/dL 9.14  7.82   Sodium 956 - 145 mmol/L 137  137   Potassium 3.5 - 5.1 mmol/L 3.8  3.7   Chloride 101 - 111 mmol/L 105  105   CO2 22 - 32 mmol/L 26  26   Calcium 8.9 - 10.3 mg/dL 9.1  9.3        Component Value Date/Time   WBC 13.1 (H) 05/12/2017 0508   RBC 4.30 05/12/2017 0508   HGB 13.3 05/12/2017 0508   HCT 39.0 05/12/2017 0508   PLT 194 05/12/2017 0508   MCV 90.7 05/12/2017 0508   MCH 30.9 05/12/2017 0508   MCHC 34.1 05/12/2017 0508   RDW 12.7 05/12/2017  0508      Parts of this note may have been dictated using voice recognition software. There may be variances in spelling and vocabulary which are unintentional. Not all errors are proofread. Please notify the [facility] if any discrepancies are noted or if the meaning of any statement is not clear.

## 2024-04-03 LAB — T4, FREE: Free T4: 1.3 ng/dL (ref 0.8–1.8)

## 2024-04-03 LAB — TSH: TSH: 1.07 m[IU]/L

## 2024-04-10 ENCOUNTER — Ambulatory Visit (INDEPENDENT_AMBULATORY_CARE_PROVIDER_SITE_OTHER)
Admission: RE | Admit: 2024-04-10 | Discharge: 2024-04-10 | Disposition: A | Discharge status: Home / Self Care | Source: Ambulatory Visit | Attending: "Endocrinology | Admitting: "Endocrinology

## 2024-04-10 DIAGNOSIS — R221 Localized swelling, mass and lump, neck: Secondary | ICD-10-CM | POA: Diagnosis not present

## 2024-04-10 DIAGNOSIS — E042 Nontoxic multinodular goiter: Secondary | ICD-10-CM

## 2024-04-10 DIAGNOSIS — E049 Nontoxic goiter, unspecified: Secondary | ICD-10-CM

## 2024-05-11 ENCOUNTER — Ambulatory Visit: Admitting: "Endocrinology

## 2024-05-16 ENCOUNTER — Encounter: Payer: Self-pay | Admitting: "Endocrinology

## 2024-05-16 ENCOUNTER — Ambulatory Visit: Admitting: "Endocrinology

## 2024-05-16 VITALS — BP 110/80 | HR 91 | Ht 63.0 in | Wt 182.0 lb

## 2024-05-16 DIAGNOSIS — E042 Nontoxic multinodular goiter: Secondary | ICD-10-CM

## 2024-05-16 NOTE — Progress Notes (Addendum)
 Outpatient Endocrinology Note Obadiah Birmingham, MD  05/16/24   Destiny Butler 1976-10-11 [id number]  Referring Provider: Jefferey Fitch, MD Primary Care Provider: Jefferey Fitch, MD Subjective  No chief complaint on file.   Assessment & Plan  Diagnoses and all orders for this visit:  Multinodular goiter -     Cancel: US  FNA BX THYROID  1ST LESION AFIRMA; Future -     Cancel: US  FNA BX THYROID  1ST LESION AFIRMA -     US  FNA BX THYROID  1ST LESION AFIRMA; Future    Destiny Butler is currently not taking any thyroid  medication.  Educated on thyroid  axis.  Recommend the following: labs WNL  Multinodular goiter at least since 09/2021 Reviewed records, per report patient had at least 6 thyroid  nodules and enlarged right lobe back in 2022, all nodules were reported spongiform/mixed solid cystic TR1/TR2 and none meet the criteria for follow-up/FNA, the largest being 3.3 cm mixed solid cystic on right mid TR2. Recommend follow-up thyroid  ultrasound given mild dysphagia and continued goiter on exam 04/2024 thyroid  ultrasound images and report reviewed: multinodular goiter: nodule #5 meets criteria for FNA, ordered: right lower pole measuring 2.9 cm x 2.7 x 2.4 cm; other nodules need to be followed up in 1 year per report: Per my review more nodules meet criteria for biopsy given the echogenicity to be hypoechoic and not necessarily isoechoic for all nodules except nodule 5 and 6 which actually appear hyperechoic.  Reached out to the radiologist who read the report of the thyroid  ultrasound to double check the results After speaking at length to the radiologist, he has changed and report with an addendum Recommend FNA also for I have reviewed current medications, nurse's notes, allergies, vital signs, past medical and surgical history, family medical history, and social history for this encounter. Counseled patient on symptoms, examination findings, lab findings, imaging  results, treatment decisions and monitoring and prognosis. The patient understood the recommendations and agrees with the treatment plan. All questions regarding treatment plan were fully answered.   Return in about 10 months (around 03/16/2025).   Obadiah Birmingham, MD  05/16/24   I have reviewed current medications, nurse's notes, allergies, vital signs, past medical and surgical history, family medical history, and social history for this encounter. Counseled patient on symptoms, examination findings, lab findings, imaging results, treatment decisions and monitoring and prognosis. The patient understood the recommendations and agrees with the treatment plan. All questions regarding treatment plan were fully answered.   History of Present Illness Destiny Butler is a 48 y.o. year old female who presents to our clinic with multinodular goiter diagnosed in 04/2024.    Never been on thyroid  medication   Symptoms suggestive of HYPOTHYROIDISM:  fatigue No weight gain No cold intolerance  No constipation  No  Symptoms suggestive of HYPERTHYROIDISM:  weight loss  No heat intolerance No hyperdefecation  No palpitations  No  Compressive symptoms:  dysphagia  Yes, certain foods  dysphonia  No positional dyspnea (especially with simultaneous arms elevation)  No  Smokes  No On biotin  No Personal history of head/neck surgery/irradiation  No         Physical Exam  BP 110/80   Pulse 91   Ht 5' 3 (1.6 m)   Wt 182 lb (82.6 kg)   LMP 04/15/2017 (Approximate)   SpO2 98%   BMI 32.24 kg/m  Constitutional: well developed, well nourished Head: normocephalic, atraumatic, no exophthalmos Eyes: sclera anicteric, no redness Neck: + thyromegaly,  no thyroid  tenderness; + nodules  Lungs: normal respiratory effort Neurology: alert and oriented, no fine hand tremor Skin: dry, no appreciable rashes Musculoskeletal: no appreciable defects Psychiatric: normal mood and  affect  Allergies Allergies  Allergen Reactions   Bee Venom Swelling   Sulfa Antibiotics Rash    Current Medications Patient's Medications  New Prescriptions   No medications on file  Previous Medications   CLOTRIMAZOLE-BETAMETHASONE (LOTRISONE) CREAM    Apply 1 Application topically 2 (two) times daily.   CYANOCOBALAMIN  (VITAMIN B12) 1000 MCG/ML INJECTION    Inject 1,000 mcg into the muscle once a week.   DOXYCYCLINE (VIBRAMYCIN) 100 MG CAPSULE    Take 100 mg by mouth 2 (two) times daily.   FEXOFENADINE (ALLEGRA) 180 MG TABLET    Take 180 mg by mouth daily as needed for allergies or rhinitis.   IBUPROFEN  (ADVIL ,MOTRIN ) 600 MG TABLET    Take 1 tablet (600 mg total) by mouth every 6 (six) hours.   MUPIROCIN OINTMENT (BACTROBAN) 2 %    Apply 1 Application topically 3 (three) times daily.   VALACYCLOVIR (VALTREX) 1000 MG TABLET      Modified Medications   No medications on file  Discontinued Medications   No medications on file    Past Medical History Past Medical History:  Diagnosis Date   Anemia    Celiac artery compression syndrome (HCC)    pt told she needs to have surgery to fix    Cold sore    Fibroid    Headache    Migraines   Heart murmur    no problems   History of blood transfusion    HLD (hyperlipidemia)    Polyuria    Sleep apnea    SVD (spontaneous vaginal delivery)    x 1    Past Surgical History Past Surgical History:  Procedure Laterality Date   BREAST SURGERY Left    tissue removed under left arm   CESAREAN SECTION     x 1   CHOLECYSTECTOMY     COLONOSCOPY  03/08/2016   Minimal internal hemrrhoids. Otherwise normal colooscopy to terminal ileum.   ESOPHAGOGASTRODUODENOSCOPY  02/04/2016   Mild gastritis. Gastric polyp (status post polypectomy) Otherwise normal EGD   LAPAROSCOPIC VAGINAL HYSTERECTOMY WITH SALPINGECTOMY Bilateral 05/11/2017   Procedure: LAPAROSCOPIC ASSISTED VAGINAL HYSTERECTOMY WITH SALPINGECTOMY;  Surgeon: Gretta Gums, MD;   Location: [facility];  Service: Gynecology;  Laterality: Bilateral;   WISDOM TOOTH EXTRACTION      Family History family history includes Alcoholism in her father; Cancer in her paternal aunt; Diabetes in her mother; Hypertension in her maternal grandmother and mother; Leukemia in her maternal aunt.  Social History Social History   Socioeconomic History   Marital status: Single    Spouse name: Not on file   Number of children: 2   Years of education: Not on file   Highest education level: Not on file  Occupational History   Occupation: payroll admin  Tobacco Use   Smoking status: Never   Smokeless tobacco: Never  Vaping Use   Vaping status: Never Used  Substance and Sexual Activity   Alcohol use: Yes    Comment: Holidays   Drug use: Not Currently    Types: Marijuana    Comment: as a young person   Sexual activity: Not on file  Other Topics Concern   Not on file  Social History Narrative   Not on file   Social Drivers of Health   Financial Resource Strain: Not  on file  Food Insecurity: Not on file  Transportation Needs: Not on file  Physical Activity: Not on file  Stress: Not on file  Social Connections: Not on file  Intimate Partner Violence: Not on file    Laboratory Investigations Lab Results  Component Value Date   TSH 1.07 04/02/2024   FREET4 1.3 04/02/2024     No results found for: TSI   No components found for: TRAB   No results found for: CHOL No results found for: HDL No results found for: LDLCALC No results found for: TRIG No results found for: [facility] Lab Results  Component Value Date   CREATININE 0.64 05/12/2017   No results found for: GFR    Component Value Date/Time   NA 137 05/12/2017 0508   K 3.8 05/12/2017 0508   CL 105 05/12/2017 0508   CO2 26 05/12/2017 0508   GLUCOSE 115 (H) 05/12/2017 0508   BUN 5 (L) 05/12/2017 0508   CREATININE 0.64 05/12/2017 0508   CALCIUM 9.1 05/12/2017 0508   GFRNONAA >60 05/12/2017 0508    GFRAA >60 05/12/2017 0508      Latest Ref Rng & Units 05/12/2017    5:08 AM 04/29/2017   10:05 AM  BMP  Glucose 65 - 99 mg/dL 884  94   BUN 6 - 20 mg/dL 5  7   Creatinine 9.55 - 1.00 mg/dL 9.35  9.31   Sodium 864 - 145 mmol/L 137  137   Potassium 3.5 - 5.1 mmol/L 3.8  3.7   Chloride 101 - 111 mmol/L 105  105   CO2 22 - 32 mmol/L 26  26   Calcium 8.9 - 10.3 mg/dL 9.1  9.3        Component Value Date/Time   WBC 13.1 (H) 05/12/2017 0508   RBC 4.30 05/12/2017 0508   HGB 13.3 05/12/2017 0508   HCT 39.0 05/12/2017 0508   PLT 194 05/12/2017 0508   MCV 90.7 05/12/2017 0508   MCH 30.9 05/12/2017 0508   MCHC 34.1 05/12/2017 0508   RDW 12.7 05/12/2017 0508      Parts of this note may have been dictated using voice recognition software. There may be variances in spelling and vocabulary which are unintentional. Not all errors are proofread. Please notify the [facility] if any discrepancies are noted or if the meaning of any statement is not clear.

## 2024-05-16 NOTE — Addendum Note (Signed)
 Addended byBETHA DARTHA ERNST on: 05/16/2024 03:50 PM   Modules accepted: Orders, Level of Service

## 2024-05-29 ENCOUNTER — Telehealth: Payer: Self-pay

## 2024-05-29 NOTE — Telephone Encounter (Signed)
-----   Message from [facility] sent at 05/16/2024  3:50 PM EDT ----- Please let the patient know that I reviewed her thyroid  ultrasound report in more detail and spoke to the radiologist to read the report.  He has changed the report in favor of my opinion and we now have another nodule on the right side of thyroid  that meets the criteria for biopsy.  Hence it is going to be a total of 2 nodules that I have ordered the biopsy for.  Thank you

## 2024-05-29 NOTE — Telephone Encounter (Signed)
 Spoke to pt regarding ultrasound report.

## 2024-05-30 ENCOUNTER — Other Ambulatory Visit (HOSPITAL_COMMUNITY)
Admission: RE | Admit: 2024-05-30 | Discharge: 2024-05-30 | Disposition: A | Discharge status: Home / Self Care | Source: Ambulatory Visit | Attending: Interventional Radiology | Admitting: Interventional Radiology

## 2024-05-30 ENCOUNTER — Ambulatory Visit
Admission: RE | Admit: 2024-05-30 | Discharge: 2024-05-30 | Disposition: A | Discharge status: Home / Self Care | Source: Ambulatory Visit | Attending: "Endocrinology | Admitting: "Endocrinology

## 2024-05-30 DIAGNOSIS — E042 Nontoxic multinodular goiter: Secondary | ICD-10-CM

## 2024-05-30 DIAGNOSIS — E041 Nontoxic single thyroid nodule: Secondary | ICD-10-CM | POA: Diagnosis not present

## 2024-05-30 NOTE — Procedures (Signed)
 PROCEDURE SUMMARY:  Using direct ultrasound guidance, 5 passes were made using 25 g needles into nodule # 4 within the mid lobe of the thyroid .   Using direct ultrasound guidance, 5 passes were made using 25 g needles into nodule #5 within the inferior lobe of the thyroid .   Ultrasound was used to confirm needle placements on all occasions.   EBL = trace  Specimens were sent to Pathology for analysis.  See procedure note under Imaging tab in Epic for full procedure details.    Electronically Signed: Carlin DELENA Griffon, PA-C 05/30/2024, 3:16 PM

## 2024-06-01 LAB — CYTOLOGY - NON PAP

## 2024-06-08 ENCOUNTER — Ambulatory Visit: Payer: Self-pay | Admitting: "Endocrinology

## 2025-03-18 ENCOUNTER — Ambulatory Visit: Admitting: "Endocrinology
